# Patient Record
Sex: Male | Born: 1975
Health system: Southern US, Community
[De-identification: ages and names within clinical notes are randomized; demographics above are authoritative.]

## PROBLEM LIST (undated history)

## (undated) DIAGNOSIS — M869 Osteomyelitis, unspecified: Secondary | ICD-10-CM

## (undated) DIAGNOSIS — A4902 Methicillin resistant Staphylococcus aureus infection, unspecified site: Secondary | ICD-10-CM

## (undated) DIAGNOSIS — E291 Testicular hypofunction: Secondary | ICD-10-CM

## (undated) DIAGNOSIS — R5383 Other fatigue: Secondary | ICD-10-CM

## (undated) DIAGNOSIS — E785 Hyperlipidemia, unspecified: Secondary | ICD-10-CM

## (undated) DIAGNOSIS — E349 Endocrine disorder, unspecified: Secondary | ICD-10-CM

## (undated) DIAGNOSIS — F900 Attention-deficit hyperactivity disorder, predominantly inattentive type: Secondary | ICD-10-CM

## (undated) DIAGNOSIS — E669 Obesity, unspecified: Secondary | ICD-10-CM

## (undated) HISTORY — DX: Methicillin resistant Staphylococcus aureus infection, unspecified site: A49.02

## (undated) HISTORY — DX: Attention-deficit hyperactivity disorder, predominantly inattentive type: F90.0

## (undated) HISTORY — DX: Hyperlipidemia, unspecified: E78.5

## (undated) HISTORY — DX: Obesity, unspecified: E66.9

## (undated) HISTORY — DX: Other fatigue: R53.83

## (undated) HISTORY — DX: Osteomyelitis, unspecified: M86.9

## (undated) HISTORY — DX: Testicular hypofunction: E29.1

## (undated) HISTORY — DX: Endocrine disorder, unspecified: E34.9

---

## 2007-04-12 LAB — BASIC METABOLIC PANEL
Glucose: 88 mg/dL
Potassium: 4.2 mmol/L (ref 3.4–5.3)

## 2007-04-12 LAB — LIPID PANEL
Cholesterol: 181 mg/dL (ref 0–200)
HDL: 40 mg/dL (ref 35–70)
Triglycerides: 74 mg/dL (ref 40–160)

## 2007-04-12 LAB — HEPATIC FUNCTION PANEL: ALT: 25 U/L (ref 10–40)

## 2012-03-16 HISTORY — PX: VASECTOMY: SHX75

## 2012-03-29 LAB — CBC AND DIFFERENTIAL
HEMOGLOBIN: 15.2 g/dL (ref 13.5–17.5)
Platelets: 197 10*3/uL (ref 150–399)
WBC: 7 10^3/mL

## 2012-03-29 LAB — LIPID PANEL
CHOLESTEROL: 224 mg/dL — AB (ref 0–200)
HDL: 43 mg/dL (ref 35–70)
LDL CALC: 154 mg/dL
Triglycerides: 135 mg/dL (ref 40–160)

## 2012-03-29 LAB — TSH: TSH: 1.46 u[IU]/mL (ref 0.41–5.90)

## 2012-03-29 LAB — HEPATIC FUNCTION PANEL
ALT: 35 U/L (ref 10–40)
AST: 27 U/L (ref 14–40)

## 2012-03-29 LAB — BASIC METABOLIC PANEL
CREATININE: 0.9 mg/dL (ref 0.6–1.3)
Glucose: 94 mg/dL
Potassium: 4.7 mmol/L (ref 3.4–5.3)

## 2012-05-08 ENCOUNTER — Emergency Department (HOSPITAL_COMMUNITY): Payer: BC Managed Care – PPO

## 2012-05-08 ENCOUNTER — Encounter (HOSPITAL_COMMUNITY): Payer: Self-pay | Admitting: *Deleted

## 2012-05-08 ENCOUNTER — Emergency Department (HOSPITAL_COMMUNITY)
Admission: EM | Admit: 2012-05-08 | Discharge: 2012-05-08 | Disposition: A | Discharge status: Home / Self Care | Payer: BC Managed Care – PPO | Attending: Emergency Medicine | Admitting: Emergency Medicine

## 2012-05-08 DIAGNOSIS — N509 Disorder of male genital organs, unspecified: Secondary | ICD-10-CM | POA: Insufficient documentation

## 2012-05-08 DIAGNOSIS — N200 Calculus of kidney: Secondary | ICD-10-CM | POA: Insufficient documentation

## 2012-05-08 LAB — URINALYSIS, ROUTINE W REFLEX MICROSCOPIC
Bilirubin Urine: NEGATIVE
Specific Gravity, Urine: 1.029 (ref 1.005–1.030)
pH: 5.5 (ref 5.0–8.0)

## 2012-05-08 LAB — URINE MICROSCOPIC-ADD ON

## 2012-05-08 LAB — CBC WITH DIFFERENTIAL/PLATELET
HCT: 40.6 % (ref 39.0–52.0)
Hemoglobin: 14.4 g/dL (ref 13.0–17.0)
Lymphocytes Relative: 25 % (ref 12–46)
Lymphs Abs: 2.1 10*3/uL (ref 0.7–4.0)
Monocytes Absolute: 0.7 10*3/uL (ref 0.1–1.0)
Monocytes Relative: 8 % (ref 3–12)
Neutro Abs: 5.4 10*3/uL (ref 1.7–7.7)
WBC: 8.4 10*3/uL (ref 4.0–10.5)

## 2012-05-08 LAB — BASIC METABOLIC PANEL
BUN: 15 mg/dL (ref 6–23)
Chloride: 101 mEq/L (ref 96–112)
GFR calc Af Amer: >90 mL/min (ref >90)
Potassium: 3.4 mEq/L — ABNORMAL LOW (ref 3.5–5.1)

## 2012-05-08 MED ORDER — HYDROMORPHONE HCL PF 1 MG/ML IJ SOLN
1.0000 mg | Freq: Once | INTRAMUSCULAR | Status: AC
  Administered 2012-05-08: 1 mg via INTRAMUSCULAR
  Filled 2012-05-08: qty 1

## 2012-05-08 MED ORDER — HYDROMORPHONE HCL PF 1 MG/ML IJ SOLN
1.0000 mg | Freq: Once | INTRAMUSCULAR | Status: AC
  Administered 2012-05-08: 1 mg via INTRAVENOUS

## 2012-05-08 MED ORDER — ONDANSETRON HCL 4 MG/2ML IJ SOLN
4.0000 mg | Freq: Once | INTRAMUSCULAR | Status: AC
  Administered 2012-05-08: 4 mg via INTRAVENOUS
  Filled 2012-05-08: qty 2

## 2012-05-08 MED ORDER — SODIUM CHLORIDE 0.9 % IV SOLN
Freq: Once | INTRAVENOUS | Status: AC
  Administered 2012-05-08: 04:00:00 via INTRAVENOUS

## 2012-05-08 MED ORDER — ONDANSETRON HCL 4 MG/2ML IJ SOLN: 4.0000 mg | Freq: Once | INTRAMUSCULAR | Status: DC

## 2012-05-08 MED ORDER — HYDROMORPHONE HCL PF 1 MG/ML IJ SOLN
1.0000 mg | Freq: Once | INTRAMUSCULAR | Status: DC
  Filled 2012-05-08: qty 1

## 2012-05-08 MED ORDER — OXYCODONE-ACETAMINOPHEN 5-325 MG PO TABS: 1.0000 | ORAL_TABLET | Freq: Four times a day (QID) | ORAL | Status: DC | PRN

## 2012-05-08 MED ORDER — KETOROLAC TROMETHAMINE 30 MG/ML IJ SOLN
30.0000 mg | Freq: Once | INTRAMUSCULAR | Status: AC
  Administered 2012-05-08: 30 mg via INTRAVENOUS
  Filled 2012-05-08: qty 1

## 2012-05-08 MED ORDER — ONDANSETRON HCL 4 MG PO TABS: 4.0000 mg | ORAL_TABLET | Freq: Four times a day (QID) | ORAL | Status: DC

## 2012-05-08 NOTE — ED Notes (Signed)
Pt sts "  more medicine would be nice " will make provider aware.

## 2012-05-08 NOTE — ED Notes (Signed)
Pt returned   From c-t 

## 2012-05-08 NOTE — ED Notes (Signed)
C/o RLQ pain. Radiates into both testicles, R>L, Onset ~ 0145. (denies: nvd, fever, bleeding, back pain or urinary sx).

## 2012-05-08 NOTE — ED Provider Notes (Signed)
Medical screening examination/treatment/procedure(s) were performed by non-physician practitioner and as supervising physician I was immediately available for consultation/collaboration.  Rionna Feltes M Yanelis Osika, MD 05/08/12 0910 

## 2012-05-08 NOTE — ED Provider Notes (Signed)
History     CSN: [id number]  Arrival date & time 05/08/12  4098   First MD Initiated Contact with Patient 05/08/12 0320      Chief Complaint  Patient presents with  . Abdominal Pain    (Consider location/radiation/quality/duration/timing/severity/associated sxs/prior treatment) HPI Comments: She was awakened about 1:30 AM with right flank pain, radiating to his abdomen into his testicle.  He cannot find a comfortable position.  He isn't nauseated, but has not vomited.  He is not having any dysuria, are noted.  Hematuria, has no history of kidney stones  The history is provided by the patient.    History reviewed. No pertinent past medical history.  History reviewed. No pertinent past surgical history.  No family history on file.  History  Substance Use Topics  . Smoking status: Never Smoker   . Smokeless tobacco: Not on file  . Alcohol Use: No      Review of Systems  Constitutional: Negative for fever and appetite change.  Respiratory: Negative for shortness of breath.   Gastrointestinal: Positive for abdominal pain. Negative for abdominal distention.  Genitourinary: Positive for flank pain. Negative for frequency and penile pain.    Allergies  Review of patient's allergies indicates no known allergies.  Home Medications   Current Outpatient Rx  Name Route Sig Dispense Refill  . CETIRIZINE HCL 10 MG PO TABS Oral Take 10 mg by mouth daily.    . IBUPROFEN 200 MG PO TABS Oral Take 800 mg by mouth every 6 (six) hours as needed. For pain    . ONDANSETRON HCL 4 MG PO TABS Oral Take 1 tablet (4 mg total) by mouth every 6 (six) hours. 12 tablet 0  . OXYCODONE-ACETAMINOPHEN 5-325 MG PO TABS Oral Take 1-2 tablets by mouth every 6 (six) hours as needed for pain. 20 tablet 0    BP 137/82  Temp 97.8 F (36.6 C) (Oral)  Resp 20  SpO2 98%  Physical Exam  Constitutional: He is oriented to person, place, and time. He appears well-developed and well-nourished.  HENT:    Head: Normocephalic.  Eyes: Pupils are equal, round, and reactive to light.  Neck: Normal range of motion.  Cardiovascular: Normal rate.   Pulmonary/Chest: Effort normal.  Abdominal: Soft. He exhibits no distension. There is no tenderness.  Musculoskeletal: Normal range of motion.  Neurological: He is alert and oriented to person, place, and time.  Skin: Skin is warm. No rash noted.    ED Course  Procedures (including critical care time)  Labs Reviewed  BASIC METABOLIC PANEL - Abnormal; Notable for the following:    Potassium 3.4 (*)     Glucose, Bld 116 (*)     All other components within normal limits  URINALYSIS, ROUTINE W REFLEX MICROSCOPIC - Abnormal; Notable for the following:    APPearance CLOUDY (*)     Hgb urine dipstick LARGE (*)     Ketones, ur 15 (*)     All other components within normal limits  URINE MICROSCOPIC-ADD ON - Abnormal; Notable for the following:    Bacteria, UA FEW (*)     All other components within normal limits  CBC WITH DIFFERENTIAL   Ct Abdomen Pelvis Wo Contrast  05/08/2012  *RADIOLOGY REPORT*  Clinical Data: Right flank pain radiating to the right testicle.  CT ABDOMEN AND PELVIS WITHOUT CONTRAST  Technique:  Multidetector CT imaging of the abdomen and pelvis was performed following the standard protocol without intravenous contrast.  Comparison: None.  Findings:  Unenhanced CT was performed per clinician order.  Lack of IV contrast limits sensitivity and specificity, especially for evaluation of abdominal/pelvic solid viscera.  Lung bases are clear.  Liver, spleen, pancreas, adrenal glands, gallbladder grossly normal.  Small and large bowel appears normal.  Normal appendix. Small hiatal hernia.  Mild right hydroureteronephrosis is present.  There is a punctate calculus identified at the right UVJ.  The left ureter appears within normal limits.  There are no residual collecting system calculi identified.  No aggressive osseous lesions.  IMPRESSION:  Punctate right UVJ stone with mild right hydroureteronephrosis.  No residual collecting system calculi.   Original Report Authenticated By: Andreas Newport, M.D.      1. Kidney stone       MDM  Can reviewed.  Patient has a small, punctate calculus.  His urine is positive for hematuria.  He reports, that he is having pressure to urinate and return of nausea        Arman Filter, NP 05/08/12 [phone]

## 2012-05-08 NOTE — ED Notes (Signed)
Pt complains of right lower abd pain, radiaiitng to testicles, no hx of kidney stones, pt sts he still has all internal organs. deneis nausea, onset at 0130

## 2012-05-08 NOTE — ED Notes (Signed)
Patient transported to CT 

## 2012-11-21 ENCOUNTER — Emergency Department
Admission: EM | Admit: 2012-11-21 | Discharge: 2012-11-21 | Disposition: A | Discharge status: Home / Self Care | Payer: BC Managed Care – PPO | Source: Home / Self Care | Attending: Family Medicine | Admitting: Family Medicine

## 2012-11-21 ENCOUNTER — Encounter: Payer: Self-pay | Admitting: *Deleted

## 2012-11-21 DIAGNOSIS — N498 Inflammatory disorders of other specified male genital organs: Secondary | ICD-10-CM

## 2012-11-21 DIAGNOSIS — L02214 Cutaneous abscess of groin: Secondary | ICD-10-CM

## 2012-11-21 DIAGNOSIS — N492 Inflammatory disorders of scrotum: Secondary | ICD-10-CM

## 2012-11-21 NOTE — ED Notes (Signed)
Richard Cordova reports two abscess x 5 days, one in right groin and on posterior testicular. He was placed on Bactrim and doxycycline 3 days ago @ an urgent care. Site is not improving. Denies fever or chills. He reports his son has a current wound and he scratched him while changing his diaper recently.

## 2012-11-21 NOTE — ED Provider Notes (Signed)
History     CSN: [id number]  Arrival date & time 11/21/12  6578   First MD Initiated Contact with Patient 11/21/12 [phone]      Chief Complaint  Patient presents with  . Cyst    HPI Groin abscesses x5 days. Patient reports developing a right groin and left posterior testicle abscess about 5 days ago. Patient states his 66-month-old son has an abscess and he believes he may have been cross contaminated from this when he was changing a diaper. Patient was initially seen in urgent care about 3-4 days ago with symptoms. Patient states that sees returning at the time. Patient placed on doxycycline Bactrim for soft tissue coverage. Patient states that periods have progressively swollen over the past 3-4 days despite antibiotics. Patient denies any fevers or chills. Has had some drainage albeit minimal.   History reviewed. No pertinent past medical history.  Past Surgical History  Procedure Laterality Date  . Vasectomy      Family History  Problem Relation Age of Onset  . Cancer Father     testicular    History  Substance Use Topics  . Smoking status: Never Smoker   . Smokeless tobacco: Never Used  . Alcohol Use: Yes      Review of Systems  All other systems reviewed and are negative.    Allergies  Review of patient's allergies indicates no known allergies.  Home Medications   Current Outpatient Rx  Name  Route  Sig  Dispense  Refill  . doxycycline (VIBRA-TABS) 100 MG tablet   Oral   Take 100 mg by mouth 2 (two) times daily.         Marland Kitchen sulfamethoxazole-trimethoprim (BACTRIM,SEPTRA) 400-80 MG per tablet   Oral   Take 1 tablet by mouth 2 (two) times daily.         . cetirizine (ZYRTEC) 10 MG tablet   Oral   Take 10 mg by mouth daily.         Marland Kitchen ibuprofen (ADVIL,MOTRIN) 200 MG tablet   Oral   Take 800 mg by mouth every 6 (six) hours as needed. For pain         . ondansetron (ZOFRAN) 4 MG tablet   Oral   Take 1 tablet (4 mg total) by mouth every 6  (six) hours.   12 tablet   0     BP 128/82  Pulse 68  Temp(Src) 98.1 F (36.7 C) (Oral)  Resp 14  Ht 6' (1.829 m)  Wt 286 lb (129.729 kg)  BMI 38.78 kg/m2  SpO2 98%  Physical Exam  Constitutional:  Obese  HENT:  Head: Normocephalic and atraumatic.  Eyes: Conjunctivae are normal. Pupils are equal, round, and reactive to light.  Neck: Normal range of motion.  Cardiovascular: Normal rate and regular rhythm.   Pulmonary/Chest: Effort normal.  Abdominal: Soft.  Genitourinary:     [place] left testicular swelling with central area of abscess that is draining a minimal amount of serous fluid.  Small right intercrural abscess in the right groin to  Musculoskeletal: Normal range of motion.  Neurological: He is alert.    ED Course  Procedures (including critical care time)  Labs Reviewed - No data to display No results found.   1. Scrotal abscess   2. Groin abscess       MDM  Noted left testicular abscess with marked hypertrophy swelling and tenderness. Will formally refer patient to urology for further management of this. Patient states he has urologist  that he's been seen for vasectomy about 3 months ago. Patient will make same day appointment for evaluation of symptoms.     The patient and/or caregiver has been counseled thoroughly with regard to treatment plan and/or medications prescribed including dosage, schedule, interactions, rationale for use, and possible side effects and they verbalize understanding. Diagnoses and expected course of recovery discussed and will return if not improved as expected or if the condition worsens. Patient and/or caregiver verbalized understanding.             Doree Albee, MD 11/21/12 1002

## 2012-12-21 ENCOUNTER — Ambulatory Visit: Payer: BC Managed Care – PPO | Admitting: Internal Medicine

## 2013-07-23 ENCOUNTER — Encounter: Payer: Self-pay | Admitting: Emergency Medicine

## 2013-07-23 ENCOUNTER — Emergency Department
Admission: EM | Admit: 2013-07-23 | Discharge: 2013-07-23 | Disposition: A | Discharge status: Home / Self Care | Payer: BC Managed Care – PPO | Source: Home / Self Care | Attending: Family Medicine | Admitting: Family Medicine

## 2013-07-23 DIAGNOSIS — L02419 Cutaneous abscess of limb, unspecified: Secondary | ICD-10-CM

## 2013-07-23 LAB — POCT CBC W AUTO DIFF (K'VILLE URGENT CARE)

## 2013-07-23 MED ORDER — CLINDAMYCIN HCL 150 MG PO CAPS: 150.0000 mg | ORAL_CAPSULE | Freq: Three times a day (TID) | ORAL | Status: DC

## 2013-07-23 MED ORDER — CLINDAMYCIN HCL 300 MG PO CAPS: 300.0000 mg | ORAL_CAPSULE | Freq: Three times a day (TID) | ORAL | Status: DC

## 2013-07-23 MED ORDER — HYDROCODONE-ACETAMINOPHEN 5-325 MG PO TABS: 1.0000 | ORAL_TABLET | Freq: Four times a day (QID) | ORAL | Status: DC | PRN

## 2013-07-23 NOTE — ED Provider Notes (Signed)
CSN: [id number]     Arrival date & time 07/23/13  4782 History   First MD Initiated Contact with Patient 07/23/13 1023     Chief Complaint  Patient presents with  . Abscess    left thigh     HPI Comments: Patient developed a small abscess on his left upper anterior thigh about 2.5 weeks ago that initially drained a small amount of pus.  The lesion has gradually increased in size and tenderness.  Over the past 2 days he has developed increasing erythema and warmth around the lesion.  No fevers, chills, and sweats.  He has a past history of abscess with MRSA.  Patient is a 37 y.o. male presenting with abscess. The history is provided by the patient.  Abscess Location:  Leg Leg abscess location:  L upper leg Size:  4cm by 3cm Abscess quality: fluctuance, painful, redness and warmth   Abscess quality: not draining and not weeping   Red streaking: no   Duration:  3 weeks Progression:  Worsening Pain details:    Quality:  Aching   Severity:  Mild   Duration:  3 days   Timing:  Constant   Progression:  Worsening Chronicity:  Recurrent Context: not diabetes   Relieved by:  Nothing Worsened by:  Nothing tried Ineffective treatments:  Draining/squeezing Associated symptoms: fatigue   Associated symptoms: no anorexia, no fever, no headaches and no nausea   Risk factors: family hx of MRSA, hx of MRSA and prior abscess     History reviewed. No pertinent past medical history. Past Surgical History  Procedure Laterality Date  . Vasectomy     Family History  Problem Relation Age of Onset  . Cancer Father     testicular   History  Substance Use Topics  . Smoking status: Never Smoker   . Smokeless tobacco: Never Used  . Alcohol Use: Yes    Review of Systems  Constitutional: Positive for fatigue. Negative for fever.  Gastrointestinal: Negative for nausea and anorexia.  Neurological: Negative for headaches.  All other systems reviewed and are negative.    Allergies  Review of  patient's allergies indicates no known allergies.  Home Medications   Current Outpatient Rx  Name  Route  Sig  Dispense  Refill  . cetirizine (ZYRTEC) 10 MG tablet   Oral   Take 10 mg by mouth daily.         . clindamycin (CLEOCIN) 150 MG capsule   Oral   Take 1 capsule (150 mg total) by mouth 3 (three) times daily.   21 capsule   0   . clindamycin (CLEOCIN) 300 MG capsule   Oral   Take 1 capsule (300 mg total) by mouth 3 (three) times daily.   21 capsule   0   . HYDROcodone-acetaminophen (NORCO/VICODIN) 5-325 MG per tablet   Oral   Take 1 tablet by mouth every 6 (six) hours as needed for moderate pain.   12 tablet   0   . ibuprofen (ADVIL,MOTRIN) 200 MG tablet   Oral   Take 800 mg by mouth every 6 (six) hours as needed. For pain          BP 147/76  Pulse 72  Temp(Src) 99.4 F (37.4 C) (Oral)  Resp 14  Ht 6' (1.829 m)  Wt 305 lb (138.347 kg)  BMI 41.36 kg/m2  SpO2 97% Physical Exam Nursing notes and Vital Signs reviewed. Appearance:  Patient appears stated age, and in no acute distress.  Patient is obese (BMI 41.4) Eyes:  Pupils are equal, round, and reactive to light and accomodation.  Extraocular movement is intact.  Conjunctivae are not inflamed   Pharynx:  Normal Neck:  Supple.  No adenopathy Lungs:  Clear to auscultation.  Breath sounds are equal.  Heart:  Regular rate and rhythm without murmurs, rubs, or gallops.  Extremities:  No edema.   Skin:  Left upper anterior thigh just inferior to inguinal crease has a tender fluctuant area about 3cm by 4cm with surrounding erythema      ED Course  Procedures  Incise and drain cyst/abscess Risks and benefits of procedure explained to patient and verbal consent obtained.  Using sterile technique and local anesthesia with 1% lidocaine with epinephrine, cleansed affected area with Betadine and saline. Identified the most fluctuant area of lesion and incised with #11 blade.  Expressed blood and purulent  material. Explored abscess cavity to break up loculations.  Inserted Iodoform gauze packing.  Bandage applied.  Patient tolerated well     Labs Reviewed  WOUND CULTURE  POCT CBC W AUTO DIFF ([facility])  WBC 12.9; LY 19.1; MO 2.7; GR 78.2; Hgb 14.7; Platelets 224    Imaging Review     MDM   1. Abscess of thigh; suspect MRSA.  Note mild leukocytosis 12.9    Wound culture pending.  Begin empiric clindaymcin 450mg  TID.  Lortab for pain.  May continue ibuprofen. Leave bandage in place until follow-up tomorrow.  Keep wound clean and dry.  May apply heating pad 2 or 3 times daily. Return for follow-up tomorrow.    Lattie Haw, MD 07/23/13 [phone]

## 2013-07-23 NOTE — ED Notes (Signed)
Denzell c/o abscess to left upper thigh/groin x 1 1/2 weeks. Site was drianing pus and stopped. Site is now red and warm, he generally feels weak and "shaky".

## 2013-07-24 ENCOUNTER — Encounter: Payer: Self-pay | Admitting: Family Medicine

## 2013-07-24 ENCOUNTER — Ambulatory Visit (INDEPENDENT_AMBULATORY_CARE_PROVIDER_SITE_OTHER): Payer: BC Managed Care – PPO | Admitting: Family Medicine

## 2013-07-24 VITALS — BP 131/82 | HR 76 | Ht 71.0 in | Wt 316.0 lb

## 2013-07-24 DIAGNOSIS — E669 Obesity, unspecified: Secondary | ICD-10-CM

## 2013-07-24 DIAGNOSIS — R5381 Other malaise: Secondary | ICD-10-CM

## 2013-07-24 DIAGNOSIS — R5383 Other fatigue: Secondary | ICD-10-CM | POA: Insufficient documentation

## 2013-07-24 DIAGNOSIS — A4902 Methicillin resistant Staphylococcus aureus infection, unspecified site: Secondary | ICD-10-CM

## 2013-07-24 DIAGNOSIS — L0390 Cellulitis, unspecified: Secondary | ICD-10-CM

## 2013-07-24 DIAGNOSIS — L0291 Cutaneous abscess, unspecified: Secondary | ICD-10-CM

## 2013-07-24 HISTORY — DX: Obesity, unspecified: E66.9

## 2013-07-24 HISTORY — DX: Other fatigue: R53.83

## 2013-07-24 HISTORY — DX: Methicillin resistant Staphylococcus aureus infection, unspecified site: A49.02

## 2013-07-24 MED ORDER — MUPIROCIN 2 % EX OINT: 1.0000 "application " | TOPICAL_OINTMENT | Freq: Two times a day (BID) | CUTANEOUS | Status: DC

## 2013-07-24 NOTE — Patient Instructions (Addendum)
If decolonization is pursued, we favor a 5- to 10-day course of therapy with the following topical agents [75,94,96]: ?Chlorhexidine gluconate daily washes (2 or 4 percent solution)  ?Mupirocin ointment (2 percent) applied to nares with a cotton-tipped applicator two to three times daily

## 2013-07-24 NOTE — Progress Notes (Signed)
CC: Nicolus Ose is a 37 y.o. male is here for MRSA   Subjective: HPI:  Very pleasant 37 year old here to establish care  Patient was seen in urgent care yesterday for incision and drainage of abscess on the left thigh. He reports that pain is significantly improved he is unsure about the redness surrounding the incision point. He has been taking clindamycin 250 mg 3 times a day without any side effects. He denies fevers, chills, nor any new skin complaints. He reports that he has had multiple MRSA culture confirmed infections and has failed doxycycline and Bactrim combination for the past. He would like know how he can try to get red of MRSA indefinitely.  Patient reports a history of fatigue that stems back over the last one or 2 years. He tells me he has had his testosterone checked in the past and it has been in the low 300s he believes it was 301 total.  He reports that he only has lack of energy without shortness of breath or weakness. Denies focal weakness motor or sensory disturbances. He has been on antidepressant medication in the past however it may be above symptoms worse.  Fatigue has also been accompanied by mild subjective depression, decreased libido.  Review of Systems - General ROS: negative for - chills, fever, night sweats, weight gain or weight loss Ophthalmic ROS: negative for - decreased vision Psychological ROS: negative for - anxiety or depression ENT ROS: negative for - hearing change, nasal congestion, tinnitus or allergies Hematological and Lymphatic ROS: negative for - bleeding problems, bruising or swollen lymph nodes Breast ROS: negative Respiratory ROS: no cough, shortness of breath, or wheezing Cardiovascular ROS: no chest pain or dyspnea on exertion Gastrointestinal ROS: no abdominal pain, change in bowel habits, or black or bloody stools Genito-Urinary ROS: negative for - genital discharge, genital ulcers, incontinence or abnormal bleeding from  genitals Musculoskeletal ROS: negative for - joint pain or muscle pain Neurological ROS: negative for - headaches or memory loss Dermatological ROS: negative for lumps, mole changes, rash and skin lesion changes other than that described above  Past Medical History  Diagnosis Date  . Obesity 07/24/2013     Family History  Problem Relation Age of Onset  . Cancer Father     testicular  . Alcoholism Father   . Testicular cancer Father      History  Substance Use Topics  . Smoking status: Never Smoker   . Smokeless tobacco: Never Used  . Alcohol Use: Yes     Objective: Filed Vitals:   07/24/13 1327  BP: 131/82  Pulse: 76    General: Alert and Oriented, No Acute Distress HEENT: Pupils equal, round, reactive to light. Conjunctivae clear.  Moist mucous membranes pharynx unremarkable Lungs: Clear to auscultation bilaterally, no wheezing/ronchi/rales.  Comfortable work of breathing. Good air movement. Cardiac: Regular rate and rhythm. Normal S1/S2.  No murmurs, rubs, nor gallops.   Abdomen: Obese and soft nontender Extremities: No peripheral edema.  Strong peripheral pulses. Trace induration and 1 cm periphery and moderate erythema at least 5 centers in the periphery of a 1 cm incision in the left proximal medial thigh region. Scant bloody and purulent discharge from a wick Mental Status: No depression, anxiety, nor agitation. Skin: Warm and dry.  Assessment & Plan: Mandell was seen today for mrsa.  Diagnoses and associated orders for this visit:  Fatigue - Testosterone, free, total  Cellulitis  Obesity  MRSA infection - mupirocin ointment (BACTROBAN) 2 %; Place 1  application into the nose 2 (two) times daily.    Fatigue: Given past evidence of hypogonadism will test total testosterone and if low will obtain confirmatory test, PSA, hemoglobin. If normal will strongly consider sleep study given his obesity Cellulitis: Improving, Incision and drainage site appears to be  clean and well healing, wick was replaced with a new iodoform quarter-inch by approximately 7 cm packing, patient was instructed on how to remove one centimeter everyday if he chooses not to followup every 2-3 days for repacking. Instructed to keep area clean and dry, certainly return on the day that the wick is removed on its own. Continue clindamycin For his recurrent MRSA infections we discussed using chlorhexidine along with Bactroban, encouraged to do this for 5 days as outlined in the patient's instruction session Signs and symptoms requring emergent/urgent reevaluation were discussed with the patient.  45 minutes spent face-to-face during visit today of which at least 50% was counseling or coordinating care regarding fatigue, cellulitis, MRSA infection.   Return if symptoms worsen or fail to improve.

## 2013-07-26 ENCOUNTER — Telehealth: Payer: Self-pay | Admitting: *Deleted

## 2013-07-26 ENCOUNTER — Encounter: Payer: Self-pay | Admitting: Family Medicine

## 2013-07-26 DIAGNOSIS — E349 Endocrine disorder, unspecified: Secondary | ICD-10-CM

## 2013-07-26 HISTORY — DX: Endocrine disorder, unspecified: E34.9

## 2013-07-26 LAB — WOUND CULTURE: Gram Stain: NONE SEEN

## 2013-07-26 LAB — TESTOSTERONE, FREE, TOTAL, SHBG: Testosterone: 111 ng/dL — ABNORMAL LOW (ref 300–890)

## 2013-07-26 NOTE — Telephone Encounter (Signed)
Pt would like to proceed with with testosterone replacement therapy.  Future orders for confirmatory labs have been placed.Just need to release the order when pt calls

## 2013-08-02 ENCOUNTER — Encounter: Payer: Self-pay | Admitting: *Deleted

## 2013-08-07 ENCOUNTER — Encounter: Payer: Self-pay | Admitting: Family Medicine

## 2013-08-07 ENCOUNTER — Encounter: Payer: Self-pay | Admitting: *Deleted

## 2013-08-07 ENCOUNTER — Ambulatory Visit (INDEPENDENT_AMBULATORY_CARE_PROVIDER_SITE_OTHER): Payer: BC Managed Care – PPO | Admitting: Family Medicine

## 2013-08-07 VITALS — BP 133/81 | HR 75 | Wt 316.0 lb

## 2013-08-07 DIAGNOSIS — Z13220 Encounter for screening for lipoid disorders: Secondary | ICD-10-CM

## 2013-08-07 DIAGNOSIS — Z79899 Other long term (current) drug therapy: Secondary | ICD-10-CM

## 2013-08-07 DIAGNOSIS — R739 Hyperglycemia, unspecified: Secondary | ICD-10-CM

## 2013-08-07 DIAGNOSIS — E291 Testicular hypofunction: Secondary | ICD-10-CM

## 2013-08-07 DIAGNOSIS — E349 Endocrine disorder, unspecified: Secondary | ICD-10-CM

## 2013-08-07 DIAGNOSIS — R7309 Other abnormal glucose: Secondary | ICD-10-CM

## 2013-08-07 LAB — LIPID PANEL
HDL: 47 mg/dL (ref >39)
LDL Cholesterol: 140 mg/dL — ABNORMAL HIGH (ref 0–99)
Total CHOL/HDL Ratio: 4.3 Ratio
VLDL: 17 mg/dL (ref 0–40)

## 2013-08-07 LAB — BASIC METABOLIC PANEL WITH GFR
Calcium: 9.4 mg/dL (ref 8.4–10.5)
Creat: 0.72 mg/dL (ref 0.50–1.35)
GFR, Est African American: >89 mL/min
GFR, Est Non African American: >89 mL/min
Sodium: 138 mEq/L (ref 135–145)

## 2013-08-07 NOTE — Progress Notes (Signed)
CC: Richard Cordova is a 37 y.o. male is here for Follow-up   Subjective: HPI:  Followup hypo-testosterone: Total testosterone 100 in the setting of fatigue decreased libido. He has been on replacement therapy in the past using injectable formulation however unsure of dosing or frequency. He denies intolerance he had to stop due to finances. He is interested in replacement therapy again. Denies family history or personal history of prostate cancer nor polycythemia.  Denies exertional chest pain, heart disease.  A. nonfasting basic metabolic panel revealed a slightly elevated sugar level back in September.  He does not think he's never had cholesterol screening for dyslipidemia   Review Of Systems Outlined In HPI  Past Medical History  Diagnosis Date  . Obesity 07/24/2013     Family History  Problem Relation Age of Onset  . Cancer Father     testicular  . Alcoholism Father   . Testicular cancer Father      History  Substance Use Topics  . Smoking status: Never Smoker   . Smokeless tobacco: Never Used  . Alcohol Use: Yes     Objective: Filed Vitals:   08/07/13 0817  BP: 133/81  Pulse: 75    Vital signs reviewed. General: Alert and Oriented, No Acute Distress HEENT: Pupils equal, round, reactive to light. Conjunctivae clear.  External ears unremarkable.  Moist mucous membranes. Lungs: Clear and comfortable work of breathing, speaking in full sentences without accessory muscle use. Cardiac: Regular rate and rhythm.  Neuro: CN II-XII grossly intact, gait normal. Extremities: No peripheral edema.  Strong peripheral pulses.  Mental Status: No depression, anxiety, nor agitation. Logical though process. Skin: Warm and dry.  Assessment & Plan: Richard Cordova was seen today for follow-up.  Diagnoses and associated orders for this visit:  Hypotestosteronism - Testosterone, free, total  Lipid screening - Lipid panel  Hyperglycemia - BASIC METABOLIC PANEL WITH GFR  High risk  medication use - PSA - Hemoglobin    Low testosterone: Uncontrolled recheck in total testosterone along with PSA and hemoglobin, his testosterone again low and the latter labs are normal will restart testosterone hearing clinic every 3 weeks Hyperglycemia: Checking fasting blood sugar today Due for dyslipidemia screening  Return if symptoms worsen or fail to improve.

## 2013-08-08 LAB — TESTOSTERONE, FREE, TOTAL, SHBG
Sex Hormone Binding: 23 nmol/L (ref 13–71)
Testosterone: 220 ng/dL — ABNORMAL LOW (ref 300–890)

## 2013-08-09 ENCOUNTER — Telehealth: Payer: Self-pay | Admitting: Family Medicine

## 2013-08-09 ENCOUNTER — Encounter: Payer: Self-pay | Admitting: Family Medicine

## 2013-08-09 DIAGNOSIS — E785 Hyperlipidemia, unspecified: Secondary | ICD-10-CM | POA: Insufficient documentation

## 2013-08-09 HISTORY — DX: Hyperlipidemia, unspecified: E78.5

## 2013-08-09 NOTE — Telephone Encounter (Signed)
Sue Lush, Will you please let Richard Cordova know that his second testosterone test confirmed a deficiency.  He can begin injections in our office every three weeks since his hemoglobin and PSA were normal.  On a side note, his fasting blood sugar was perfect.  Cholesterol is technically elevated but not to a degree that he needs to consider medications yet.  We'll check this sometime in the spring after he starts testosterone.

## 2013-08-10 NOTE — Telephone Encounter (Signed)
Pt notified and transferred up front to schedule a nurse visit

## 2013-08-13 ENCOUNTER — Encounter: Payer: Self-pay | Admitting: *Deleted

## 2013-08-13 ENCOUNTER — Ambulatory Visit (INDEPENDENT_AMBULATORY_CARE_PROVIDER_SITE_OTHER): Payer: BC Managed Care – PPO | Admitting: Family Medicine

## 2013-08-13 VITALS — BP 139/81 | HR 84

## 2013-08-13 DIAGNOSIS — E291 Testicular hypofunction: Secondary | ICD-10-CM

## 2013-08-13 MED ORDER — TESTOSTERONE CYPIONATE 200 MG/ML IM SOLN
300.0000 mg | Freq: Once | INTRAMUSCULAR | Status: AC
  Administered 2013-08-13: 300 mg via INTRAMUSCULAR

## 2013-08-13 NOTE — Progress Notes (Signed)
   Subjective:    Patient ID: Richard Cordova, male    DOB: 11/11/1975, 37 y.o.   MRN: [id number] Testosterone injection given IM LUOQ. No complications. Anett Ranker, CMA HPI    Review of Systems     Objective:   Physical Exam        Assessment & Plan:   

## 2013-08-13 NOTE — Progress Notes (Signed)
I was present for all necessary aspect of today's encounter

## 2013-08-29 ENCOUNTER — Encounter: Payer: Self-pay | Admitting: *Deleted

## 2013-08-29 ENCOUNTER — Encounter: Payer: Self-pay | Admitting: Family Medicine

## 2013-08-29 DIAGNOSIS — B009 Herpesviral infection, unspecified: Secondary | ICD-10-CM | POA: Insufficient documentation

## 2013-09-03 ENCOUNTER — Ambulatory Visit (INDEPENDENT_AMBULATORY_CARE_PROVIDER_SITE_OTHER): Payer: BC Managed Care – PPO | Admitting: Family Medicine

## 2013-09-03 ENCOUNTER — Encounter: Payer: Self-pay | Admitting: *Deleted

## 2013-09-03 VITALS — BP 139/86 | HR 76 | Wt 322.0 lb

## 2013-09-03 DIAGNOSIS — E291 Testicular hypofunction: Secondary | ICD-10-CM

## 2013-09-03 MED ORDER — TESTOSTERONE CYPIONATE 200 MG/ML IM SOLN
300.0000 mg | INTRAMUSCULAR | Status: DC
  Administered 2013-09-03: 300 mg via INTRAMUSCULAR

## 2013-09-03 NOTE — Progress Notes (Signed)
Patient was in office for Testosterone injection 300 ml was given RUOQ. Patient did not have any complaints of headaches, dizziness or shortness of breath. Rhonda Cunningham,CMA

## 2013-09-24 ENCOUNTER — Encounter: Payer: Self-pay | Admitting: *Deleted

## 2013-09-24 ENCOUNTER — Ambulatory Visit (INDEPENDENT_AMBULATORY_CARE_PROVIDER_SITE_OTHER): Payer: BC Managed Care – PPO | Admitting: Family Medicine

## 2013-09-24 VITALS — BP 123/79 | HR 65 | Temp 98.1°F | Ht 72.0 in | Wt 316.0 lb

## 2013-09-24 DIAGNOSIS — E349 Endocrine disorder, unspecified: Secondary | ICD-10-CM

## 2013-09-24 DIAGNOSIS — E291 Testicular hypofunction: Secondary | ICD-10-CM

## 2013-09-24 MED ORDER — TESTOSTERONE CYPIONATE 200 MG/ML IM SOLN
300.0000 mg | Freq: Once | INTRAMUSCULAR | Status: AC
  Administered 2013-09-24: 300 mg via INTRAMUSCULAR

## 2013-09-24 NOTE — Progress Notes (Signed)
Pt here for testosterone injection no SOB or CP. Pt tolerated injection well in LUOQ. Pt to RTC in 3wks.Loralee PacasBarkley, Marian Meneely BuckinghamLynetta

## 2013-10-15 ENCOUNTER — Ambulatory Visit (INDEPENDENT_AMBULATORY_CARE_PROVIDER_SITE_OTHER): Payer: BC Managed Care – PPO | Admitting: Family Medicine

## 2013-10-15 ENCOUNTER — Encounter: Payer: Self-pay | Admitting: Family Medicine

## 2013-10-15 VITALS — BP 132/74 | HR 75 | Wt 320.0 lb

## 2013-10-15 DIAGNOSIS — E291 Testicular hypofunction: Secondary | ICD-10-CM

## 2013-10-15 DIAGNOSIS — E349 Endocrine disorder, unspecified: Secondary | ICD-10-CM

## 2013-10-15 MED ORDER — TESTOSTERONE CYPIONATE 200 MG/ML IM SOLN
300.0000 mg | INTRAMUSCULAR | Status: DC
  Administered 2013-10-15: 300 mg via INTRAMUSCULAR

## 2013-10-15 MED ORDER — TESTOSTERONE CYPIONATE 200 MG/ML IM SOLN
200.0000 mg | Freq: Once | INTRAMUSCULAR | Status: DC
Start: 2013-10-15 — End: 2013-10-15

## 2013-10-15 NOTE — Progress Notes (Signed)
Pt here for testosterone injection no SOB,CP or mood swings. Injection tolerated well given RUOQ.Richard Cordova  

## 2013-11-05 ENCOUNTER — Ambulatory Visit (INDEPENDENT_AMBULATORY_CARE_PROVIDER_SITE_OTHER): Payer: BC Managed Care – PPO | Admitting: Family Medicine

## 2013-11-05 ENCOUNTER — Encounter: Payer: Self-pay | Admitting: *Deleted

## 2013-11-05 VITALS — BP 119/74 | HR 69 | Wt 321.0 lb

## 2013-11-05 DIAGNOSIS — E291 Testicular hypofunction: Secondary | ICD-10-CM

## 2013-11-05 DIAGNOSIS — E349 Endocrine disorder, unspecified: Secondary | ICD-10-CM

## 2013-11-05 MED ORDER — TESTOSTERONE CYPIONATE 200 MG/ML IM SOLN
300.0000 mg | Freq: Once | INTRAMUSCULAR | Status: AC
  Administered 2013-11-05: 300 mg via INTRAMUSCULAR

## 2013-11-05 NOTE — Progress Notes (Signed)
   Subjective:    Patient ID: Richard HammockJohn Cordova, male    DOB: Mar 07, 1976, 38 y.o.   MRN: [id number] Testosterone injection given IM LUOQ. No complications. Richard AnonAmber Stephanieann Cordova, CMA HPI    Review of Systems     Objective:   Physical Exam        Assessment & Plan:

## 2013-11-26 ENCOUNTER — Ambulatory Visit (INDEPENDENT_AMBULATORY_CARE_PROVIDER_SITE_OTHER): Payer: BC Managed Care – PPO | Admitting: Family Medicine

## 2013-11-26 VITALS — BP 120/73 | HR 74 | Ht 72.0 in | Wt 325.0 lb

## 2013-11-26 DIAGNOSIS — E291 Testicular hypofunction: Secondary | ICD-10-CM

## 2013-11-26 MED ORDER — TESTOSTERONE CYPIONATE 200 MG/ML IM SOLN
300.0000 mg | INTRAMUSCULAR | Status: DC
  Administered 2013-11-26: 300 mg via INTRAMUSCULAR

## 2013-11-26 NOTE — Progress Notes (Signed)
I was present for all necessary aspects of today's encounter. 

## 2013-12-14 ENCOUNTER — Ambulatory Visit: Payer: BC Managed Care – PPO

## 2013-12-14 ENCOUNTER — Encounter: Payer: Self-pay | Admitting: Family Medicine

## 2013-12-14 ENCOUNTER — Ambulatory Visit (INDEPENDENT_AMBULATORY_CARE_PROVIDER_SITE_OTHER): Payer: BC Managed Care – PPO | Admitting: Family Medicine

## 2013-12-14 VITALS — BP 133/73 | HR 70 | Ht 72.0 in | Wt 329.0 lb

## 2013-12-14 DIAGNOSIS — E291 Testicular hypofunction: Secondary | ICD-10-CM

## 2013-12-14 DIAGNOSIS — J309 Allergic rhinitis, unspecified: Secondary | ICD-10-CM

## 2013-12-14 DIAGNOSIS — J302 Other seasonal allergic rhinitis: Secondary | ICD-10-CM

## 2013-12-14 MED ORDER — TESTOSTERONE CYPIONATE 200 MG/ML IM SOLN
300.0000 mg | INTRAMUSCULAR | Status: DC
  Administered 2013-12-14: 300 mg via INTRAMUSCULAR

## 2013-12-14 MED ORDER — MONTELUKAST SODIUM 10 MG PO TABS: 10.0000 mg | ORAL_TABLET | Freq: Every day | ORAL | Status: DC

## 2013-12-14 MED ORDER — ALBUTEROL SULFATE HFA 108 (90 BASE) MCG/ACT IN AERS: INHALATION_SPRAY | RESPIRATORY_TRACT | Status: DC

## 2013-12-14 NOTE — Progress Notes (Signed)
CC: Richard HammockJohn Cordova is a 38 y.o. male is here for Shortness of Breath   Subjective: HPI:  Complains of shortness of breath the last week. Present all hours of the day but seems to be worse when he is at home. Described as inability to take a full deep breath. Accompanied by snoring and what his wife describes as painting while sleeping. Symptoms are greatly improved soon after taking Zyrtec, effectiveness last 5 hours however slowly wears off throughout the day. Accompanied by subjective postnasal drip. Denies fevers, chills, orthopnea, peripheral edema, cough,, chest pain, nor rashes. Review of systems is positive for wheezing.   Review Of Systems Outlined In HPI  Past Medical History  Diagnosis Date  . Obesity 07/24/2013    Past Surgical History  Procedure Laterality Date  . Vasectomy  03/2012   Family History  Problem Relation Age of Onset  . Cancer Father     testicular  . Alcoholism Father   . Testicular cancer Father     History   Social History  . Marital Status: Married    Spouse Name: N/A    Number of Children: N/A  . Years of Education: N/A   Occupational History  . Not on file.   Social History Main Topics  . Smoking status: Never Smoker   . Smokeless tobacco: Never Used  . Alcohol Use: Yes  . Drug Use: No  . Sexual Activity: Not on file   Other Topics Concern  . Not on file   Social History Narrative  . No narrative on file     Objective: BP 133/73  Pulse 70  Ht 6' (1.829 m)  Wt 329 lb (149.233 kg)  BMI 44.61 kg/m2  General: Alert and Oriented, No Acute Distress HEENT: Pupils equal, round, reactive to light. Conjunctivae clear.  External ears unremarkable, canals clear with intact TMs with appropriate landmarks.  Middle ear appears open without effusion. Pink inferior turbinates.  Moist mucous membranes, pharynx without inflammation nor lesions however mild cobblestoning.  Neck supple without palpable lymphadenopathy nor abnormal masses. Lungs: Clear to  auscultation bilaterally, no wheezing/ronchi/rales.  Comfortable work of breathing. Good air movement. Cardiac: Regular rate and rhythm. Normal S1/S2.  No murmurs, rubs, nor gallops.   Extremities: No peripheral edema.  Strong peripheral pulses.  Mental Status: No depression, anxiety, nor agitation. Skin: Warm and dry.  Assessment & Plan: Richard Cordova was seen today for shortness of breath.  Diagnoses and associated orders for this visit:  Seasonal allergies - montelukast (SINGULAIR) 10 MG tablet; Take 1 tablet (10 mg total) by mouth at bedtime. - albuterol (PROVENTIL HFA;VENTOLIN HFA) 108 (90 BASE) MCG/ACT inhaler; Inhale two puffs every 4-6 hours only as needed for shortness of breath or wheezing.  Male hypogonadism - testosterone cypionate (DEPOTESTOTERONE CYPIONATE) injection 300 mg; Inject 1.5 mLs (300 mg total) into the muscle every 14 (fourteen) days.    Seasonal allergies are most likely causing shortness of breath with an element of reactive airway disease therefore start Singulair. Albuterol only if needed.  He is due for his every 2 weeks Testosterone injection today.    Return in about 4 weeks (around 01/11/2014), or if symptoms worsen or fail to improve.

## 2013-12-28 ENCOUNTER — Ambulatory Visit (INDEPENDENT_AMBULATORY_CARE_PROVIDER_SITE_OTHER): Payer: BC Managed Care – PPO | Admitting: Family Medicine

## 2013-12-28 VITALS — BP 114/44 | HR 79 | Ht 72.0 in | Wt 328.0 lb

## 2013-12-28 DIAGNOSIS — E291 Testicular hypofunction: Secondary | ICD-10-CM

## 2013-12-28 MED ORDER — TESTOSTERONE CYPIONATE 200 MG/ML IM SOLN
300.0000 mg | INTRAMUSCULAR | Status: DC
  Administered 2013-12-28: 300 mg via INTRAMUSCULAR

## 2013-12-28 NOTE — Progress Notes (Signed)
I was present for all necessary aspects of today's encounter.  Bjorn LoserRhonda,  Can you please document how much was given and where it was administered?

## 2013-12-28 NOTE — Progress Notes (Signed)
Patient was in office for Testosterone injection. 300 mg was given RUOQ. Patient denied any headaches, SOB or dizziness. Sharell Hilmer,CMA

## 2014-01-18 ENCOUNTER — Encounter: Payer: Self-pay | Admitting: *Deleted

## 2014-01-18 ENCOUNTER — Ambulatory Visit (INDEPENDENT_AMBULATORY_CARE_PROVIDER_SITE_OTHER): Payer: BC Managed Care – PPO | Admitting: Family Medicine

## 2014-01-18 VITALS — BP 111/73 | HR 73 | Wt 312.0 lb

## 2014-01-18 DIAGNOSIS — E291 Testicular hypofunction: Secondary | ICD-10-CM

## 2014-01-18 MED ORDER — TESTOSTERONE CYPIONATE 200 MG/ML IM SOLN
300.0000 mg | Freq: Once | INTRAMUSCULAR | Status: AC
  Administered 2014-01-18: 300 mg via INTRAMUSCULAR

## 2014-01-18 NOTE — Progress Notes (Signed)
   Subjective:    Patient ID: Richard Cordova, male    DOB: 1976-01-18, 38 y.o.   MRN: [id number] Testosterone given IM LUOQ. No complications.  Pt was asking about a "stopping point" where the testosterone is stopped & HcG is given.  I advised him that we don't do those injections and that I would ask you about it. Donne Anon, CMA HPI    Review of Systems     Objective:   Physical Exam        Assessment & Plan:

## 2014-01-18 NOTE — Progress Notes (Signed)
   Subjective:    Patient ID: Richard Cordova, male    DOB: 20-Jul-1976, 38 y.o.   MRN: [id number] Pt notified & does not want a referral to Endocrine.  I advised him that he is due for labs next Friday & asked him to call us on Thursday so we can place the order & fax to lab. Donne Anon, CMA HPI    Review of Systems     Objective:   Physical Exam        Assessment & Plan:

## 2014-01-25 ENCOUNTER — Telehealth: Payer: Self-pay | Admitting: *Deleted

## 2014-01-25 DIAGNOSIS — E785 Hyperlipidemia, unspecified: Secondary | ICD-10-CM

## 2014-01-25 DIAGNOSIS — E349 Endocrine disorder, unspecified: Secondary | ICD-10-CM

## 2014-01-25 NOTE — Telephone Encounter (Signed)
Pt called and left a message that he needs labs drawn. He is due for testosterone labs but also lipid panel is due.I spoke with the patient about this and he is going to come in Monday fasting for all labs. Pt should f/u with [facility] in the near future

## 2014-01-28 LAB — LIPID PANEL
CHOL/HDL RATIO: 5.2 ratio
CHOLESTEROL: 202 mg/dL — AB (ref 0–200)
HDL: 39 mg/dL — ABNORMAL LOW (ref >39)
LDL Cholesterol: 145 mg/dL — ABNORMAL HIGH (ref 0–99)
TRIGLYCERIDES: 92 mg/dL (ref <150)
VLDL: 18 mg/dL (ref 0–40)

## 2014-01-28 LAB — HEMOGLOBIN: HEMOGLOBIN: 17.5 g/dL — AB (ref 13.0–17.0)

## 2014-01-29 ENCOUNTER — Telehealth: Payer: Self-pay | Admitting: Family Medicine

## 2014-01-29 DIAGNOSIS — E785 Hyperlipidemia, unspecified: Secondary | ICD-10-CM

## 2014-01-29 DIAGNOSIS — E349 Endocrine disorder, unspecified: Secondary | ICD-10-CM

## 2014-01-29 LAB — TESTOSTERONE, FREE, TOTAL, SHBG
Sex Hormone Binding: 20 nmol/L (ref 13–71)
Testosterone, Free: 264.4 pg/mL — ABNORMAL HIGH (ref 47.0–244.0)
Testosterone-% Free: 3 % — ABNORMAL HIGH (ref 1.6–2.9)
Testosterone: 896 ng/dL — ABNORMAL HIGH (ref 300–890)

## 2014-01-29 MED ORDER — TESTOSTERONE CYPIONATE 200 MG/ML IM SOLN: INTRAMUSCULAR | Status: DC

## 2014-01-29 MED ORDER — ATORVASTATIN CALCIUM 10 MG PO TABS: 10.0000 mg | ORAL_TABLET | Freq: Every day | ORAL | Status: DC

## 2014-01-29 NOTE — Telephone Encounter (Signed)
Pt.notified

## 2014-01-29 NOTE — Telephone Encounter (Signed)
Sue Lushndrea, Will you please let patient know that his testosterone level and hemoglobin are both above the upper limit of normal.  Hs testosterone dosing regimen is too high.  I'd recommend he continue to receive injections every 3 weeks however lower the dose to 200mg  at every injection. We'll repeat these labs in 3 months.  Cholesterol continues to be elevated and has worsened since being checked in the winter. I'd recommend starting generic Lipitor which I've sent to his [facility] on [facility].  F/U with me in 3 months.

## 2014-02-08 ENCOUNTER — Ambulatory Visit (INDEPENDENT_AMBULATORY_CARE_PROVIDER_SITE_OTHER): Payer: BC Managed Care – PPO | Admitting: Family Medicine

## 2014-02-08 VITALS — BP 128/81 | HR 72 | Ht 72.0 in | Wt 306.0 lb

## 2014-02-08 DIAGNOSIS — E291 Testicular hypofunction: Secondary | ICD-10-CM

## 2014-02-08 MED ORDER — TESTOSTERONE CYPIONATE 200 MG/ML IM SOLN
200.0000 mg | Freq: Once | INTRAMUSCULAR | Status: AC
  Administered 2014-02-08: 200 mg via INTRAMUSCULAR

## 2014-02-08 NOTE — Progress Notes (Signed)
   Subjective:    Patient ID: Richard HammockJohn Cordova, male    DOB: 22-Jul-1976, 38 y.o.   MRN: [id number]  HPI Patient presents today for testosterone injection. Patient received injection on his RUOQ without complication. Patient denies headaches, SOB and mood swings. No other concerns at this time.   Review of Systems     Objective:   Physical Exam        Assessment & Plan:

## 2014-03-04 ENCOUNTER — Ambulatory Visit (INDEPENDENT_AMBULATORY_CARE_PROVIDER_SITE_OTHER): Payer: BC Managed Care – PPO | Admitting: Family Medicine

## 2014-03-04 ENCOUNTER — Encounter: Payer: Self-pay | Admitting: Family Medicine

## 2014-03-04 VITALS — BP 128/83 | HR 97 | Temp 98.0°F | Ht 72.0 in | Wt 313.0 lb

## 2014-03-04 DIAGNOSIS — H109 Unspecified conjunctivitis: Secondary | ICD-10-CM

## 2014-03-04 DIAGNOSIS — R41840 Attention and concentration deficit: Secondary | ICD-10-CM

## 2014-03-04 DIAGNOSIS — H1089 Other conjunctivitis: Secondary | ICD-10-CM

## 2014-03-04 DIAGNOSIS — H66001 Acute suppurative otitis media without spontaneous rupture of ear drum, right ear: Secondary | ICD-10-CM

## 2014-03-04 DIAGNOSIS — A499 Bacterial infection, unspecified: Secondary | ICD-10-CM

## 2014-03-04 DIAGNOSIS — B9689 Other specified bacterial agents as the cause of diseases classified elsewhere: Secondary | ICD-10-CM

## 2014-03-04 DIAGNOSIS — H66009 Acute suppurative otitis media without spontaneous rupture of ear drum, unspecified ear: Secondary | ICD-10-CM

## 2014-03-04 MED ORDER — AMOXICILLIN-POT CLAVULANATE 500-125 MG PO TABS: ORAL_TABLET | ORAL | Status: AC

## 2014-03-04 MED ORDER — POLYMYXIN B-TRIMETHOPRIM 10000-0.1 UNIT/ML-% OP SOLN: 2.0000 [drp] | OPHTHALMIC | Status: DC

## 2014-03-04 NOTE — Progress Notes (Signed)
CC: Lavella HammockJohn Burzynski is a 38 y.o. male is here for Conjunctivitis and URI   Subjective: HPI:  Complains of sore throat, right ear fullness, fatigue, and reddening of the right eye that's been present for the past 1-1/2 weeks. Was initially accompanied by fever of 101.0 however this has not been present for the past week.  Has tried over-the-counter cold medicine without any improvement. Has also tried a five-day course of azithromycin when seen a little over a week ago in urgent care office.  Symptoms overall are moderate in severity nothing particularly makes better or worse. They occur all hours of the day. Denies cough, shortness of breath, choking, difficulty swallowing, drainage from the ear, nasal congestion, facial pressure. Review of systems positive for decreased hearing in the right ear. Denies photophobia nor drainage from the right eye, denies vision loss nor eye pain  Patient requests formal evaluation for ADD   Review Of Systems Outlined In HPI  Past Medical History  Diagnosis Date  . Obesity 07/24/2013    Past Surgical History  Procedure Laterality Date  . Vasectomy  03/2012   Family History  Problem Relation Age of Onset  . Cancer Father     testicular  . Alcoholism Father   . Testicular cancer Father     History   Social History  . Marital Status: Married    Spouse Name: N/A    Number of Children: N/A  . Years of Education: N/A   Occupational History  . Not on file.   Social History Main Topics  . Smoking status: Never Smoker   . Smokeless tobacco: Never Used  . Alcohol Use: Yes  . Drug Use: No  . Sexual Activity: Not on file   Other Topics Concern  . Not on file   Social History Narrative  . No narrative on file     Objective: BP 128/83  Pulse 97  Temp(Src) 98 F (36.7 C)  Ht 6' (1.829 m)  Wt 313 lb (141.976 kg)  BMI 42.44 kg/m2  General: Alert and Oriented, No Acute Distress HEENT: Pupils equal, round, reactive to light. Left Conjunctivae clear  however right conjunctiva are peripherally erythematous sparing the limbus.  External ears unremarkable, canals clear with intact TMs with appropriate landmarks. Left Middle ear appears open without effusion, right middle ear has mild opaque effusion. Pink inferior turbinates.  Moist mucous membranes, pharynx without inflammation nor lesions.  Neck supple without palpable lymphadenopathy nor abnormal masses. Lungs: Clear to auscultation bilaterally, no wheezing/ronchi/rales.  Comfortable work of breathing. Good air movement. Extremities: No peripheral edema.  Strong peripheral pulses.  Mental Status: No depression, anxiety, nor agitation. Skin: Warm and dry.  Assessment & Plan: Richard Cordova was seen today for conjunctivitis and uri.  Diagnoses and associated orders for this visit:  Acute suppurative otitis media of right ear without spontaneous rupture of tympanic membrane, recurrence not specified - amoxicillin-clavulanate (AUGMENTIN) 500-125 MG per tablet; Take one by mouth every 8 hours for ten total days.  Bacterial conjunctivitis - trimethoprim-polymyxin b (POLYTRIM) ophthalmic solution; Place 2 drops into the right eye every 4 (four) hours. For seven days. - Ambulatory referral to Psychiatry  Poor concentration    Right otitis media: Start Augmentin, if no improvement after Wednesday the next step would be to add prednisone Bacterial conjunctivitis: Start Polytrim Poor concentration: Referral to Dr. Marisue BrooklynAmy Stevenson for formal ADD evaluation   Return if symptoms worsen or fail to improve.

## 2014-03-07 ENCOUNTER — Other Ambulatory Visit: Payer: Self-pay | Admitting: Family Medicine

## 2014-03-07 ENCOUNTER — Encounter: Payer: Self-pay | Admitting: Family Medicine

## 2014-03-07 DIAGNOSIS — F900 Attention-deficit hyperactivity disorder, predominantly inattentive type: Secondary | ICD-10-CM | POA: Insufficient documentation

## 2014-03-07 HISTORY — DX: Attention-deficit hyperactivity disorder, predominantly inattentive type: F90.0

## 2014-03-08 ENCOUNTER — Ambulatory Visit: Payer: BC Managed Care – PPO

## 2014-03-12 ENCOUNTER — Ambulatory Visit (INDEPENDENT_AMBULATORY_CARE_PROVIDER_SITE_OTHER): Payer: BC Managed Care – PPO | Admitting: Family Medicine

## 2014-03-12 VITALS — BP 140/90 | HR 94 | Temp 98.4°F | Wt 305.0 lb

## 2014-03-12 DIAGNOSIS — E291 Testicular hypofunction: Secondary | ICD-10-CM | POA: Diagnosis not present

## 2014-03-12 MED ORDER — TESTOSTERONE CYPIONATE 200 MG/ML IM SOLN
200.0000 mg | Freq: Once | INTRAMUSCULAR | Status: AC
  Administered 2014-03-12: 200 mg via INTRAMUSCULAR

## 2014-03-12 NOTE — Progress Notes (Signed)
Pt came in today for his testosterone injection and tolerated well. He received 200 mg in the LUOQ. Denies chest pain, ha, palpitations, SOB, mood changes./Damarien Nyman,CMA

## 2014-03-27 ENCOUNTER — Encounter: Payer: Self-pay | Admitting: Family Medicine

## 2014-04-02 ENCOUNTER — Ambulatory Visit: Payer: BC Managed Care – PPO

## 2014-04-08 ENCOUNTER — Ambulatory Visit (INDEPENDENT_AMBULATORY_CARE_PROVIDER_SITE_OTHER): Payer: BC Managed Care – PPO | Admitting: Family Medicine

## 2014-04-08 VITALS — BP 128/86 | HR 102 | Ht 72.0 in | Wt 304.0 lb

## 2014-04-08 DIAGNOSIS — E291 Testicular hypofunction: Secondary | ICD-10-CM | POA: Diagnosis not present

## 2014-04-08 MED ORDER — TESTOSTERONE CYPIONATE 200 MG/ML IM SOLN
200.0000 mg | Freq: Once | INTRAMUSCULAR | Status: AC
  Administered 2014-04-08: 200 mg via INTRAMUSCULAR

## 2014-04-08 NOTE — Progress Notes (Signed)
   Subjective:    Patient ID: Richard Cordova, male    DOB: May 26, 1976, 38 y.o.   MRN: [id number]  HPI Patient presents today for testosterone injection which he rec'd in his RUOQ with out complication. Patient denies mood swings, CP or SOB. No other concerns at this time. [name], CMA    Review of Systems     Objective:   Physical Exam        Assessment & Plan:

## 2014-04-24 ENCOUNTER — Encounter: Payer: Self-pay | Admitting: Physician Assistant

## 2014-04-24 ENCOUNTER — Ambulatory Visit (INDEPENDENT_AMBULATORY_CARE_PROVIDER_SITE_OTHER): Payer: BC Managed Care – PPO | Admitting: Physician Assistant

## 2014-04-24 VITALS — BP 105/72 | HR 80 | Ht 72.0 in | Wt 307.0 lb

## 2014-04-24 DIAGNOSIS — B880 Other acariasis: Secondary | ICD-10-CM

## 2014-04-24 DIAGNOSIS — B8809 Other acariasis: Secondary | ICD-10-CM

## 2014-04-24 MED ORDER — SUMATRIPTAN SUCCINATE 100 MG PO TABS: 100.0000 mg | ORAL_TABLET | ORAL | Status: DC | PRN

## 2014-04-24 MED ORDER — DIAZEPAM 2 MG PO TABS: 2.0000 mg | ORAL_TABLET | Freq: Two times a day (BID) | ORAL | Status: DC | PRN

## 2014-04-24 MED ORDER — METHYLPREDNISOLONE SODIUM SUCC 125 MG IJ SOLR
125.0000 mg | Freq: Once | INTRAMUSCULAR | Status: AC
Start: 2014-04-24 — End: 2014-04-24
  Administered 2014-04-24: 125 mg via INTRAMUSCULAR

## 2014-04-24 MED ORDER — CLOBETASOL PROPIONATE 0.05 % EX CREA: 1.0000 "application " | TOPICAL_CREAM | Freq: Two times a day (BID) | CUTANEOUS | Status: DC

## 2014-04-24 NOTE — Progress Notes (Signed)
   Subjective:    Patient ID: Richard Cordova, male    DOB: 1976-03-11, 38 y.o.   MRN: [id number]  HPI Pt presents to the clinic with small red dots all over bilateral ankles and up legs along with some on bilateral forearm. Spare trunk and face and around shoes. Camping all weekend. Noticed itching bumps on Monday morning. Tried OTC hydrocortisone did not help. No fever, chills, nausea, vomiting or diarrhea.    Review of Systems  All other systems reviewed and are negative.      Objective:   Physical Exam  Constitutional: He is oriented to person, place, and time. He appears well-developed and well-nourished.  HENT:  Head: Normocephalic and atraumatic.  Cardiovascular: Normal rate, regular rhythm and normal heart sounds.   Pulmonary/Chest: Effort normal and breath sounds normal.  Neurological: He is alert and oriented to person, place, and time.  Skin:  Small red papular spots on bilateral ankles, legs, arms. Sparing trunk.  No vesicles.not open or oozing.   Psychiatric: He has a normal mood and affect. His behavior is normal.          Assessment & Plan:  Chiggers- gave HO on how to prevent. Solumedrol  given IM today. clobetosol for future use or as needed to stop itch. Discussed cold compresses. Benadryl orally for itch.

## 2014-04-30 ENCOUNTER — Ambulatory Visit: Payer: BC Managed Care – PPO

## 2014-05-07 ENCOUNTER — Ambulatory Visit (INDEPENDENT_AMBULATORY_CARE_PROVIDER_SITE_OTHER): Payer: BC Managed Care – PPO | Admitting: Family Medicine

## 2014-05-07 ENCOUNTER — Encounter: Payer: Self-pay | Admitting: Family Medicine

## 2014-05-07 VITALS — BP 123/79 | Wt 298.0 lb

## 2014-05-07 DIAGNOSIS — E291 Testicular hypofunction: Secondary | ICD-10-CM

## 2014-05-07 MED ORDER — TESTOSTERONE CYPIONATE 200 MG/ML IM SOLN
200.0000 mg | Freq: Once | INTRAMUSCULAR | Status: AC
  Administered 2014-05-07: 200 mg via INTRAMUSCULAR

## 2014-05-07 NOTE — Progress Notes (Signed)
   Subjective:    Patient ID: Richard Cordova, male    DOB: [birth year], 38 y.o.   MRN: [id number]  HPI Patient here for a testosterone injection; denies chest pain, shortness of breath, headaches or mood changes.   Review of Systems     Objective:   Physical Exam        Assessment & Plan:  Patient tolerating injection/medication well without complications.Advised patient to schedule his next injection 3 weeks from today.

## 2014-05-28 ENCOUNTER — Ambulatory Visit (INDEPENDENT_AMBULATORY_CARE_PROVIDER_SITE_OTHER): Payer: BC Managed Care – PPO | Admitting: Family Medicine

## 2014-05-28 VITALS — BP 129/88 | HR 80 | Temp 98.4°F | Ht 72.0 in | Wt 303.0 lb

## 2014-05-28 DIAGNOSIS — E349 Endocrine disorder, unspecified: Secondary | ICD-10-CM

## 2014-05-28 DIAGNOSIS — E291 Testicular hypofunction: Secondary | ICD-10-CM

## 2014-05-28 DIAGNOSIS — Z79899 Other long term (current) drug therapy: Secondary | ICD-10-CM

## 2014-05-28 MED ORDER — TESTOSTERONE CYPIONATE 200 MG/ML IM SOLN
200.0000 mg | Freq: Once | INTRAMUSCULAR | Status: AC
  Administered 2014-05-28: 200 mg via INTRAMUSCULAR

## 2014-05-28 NOTE — Progress Notes (Signed)
   Subjective:    Patient ID: Richard HammockJohn Cordova, male    DOB: 05-Jan-1976, 38 y.o.   MRN: [id number]  HPI Richard RuizJohn reports today for testosterone injection which he rec'd without complication. He denies CP, SOB or mood swings at this time. No other complications or GU symptoms. Richard SkainsJamie Vianne Cordova, CMA    Review of Systems     Objective:   Physical Exam        Assessment & Plan:  Advised to schedule next testosterone for 3 weeks.

## 2014-05-28 NOTE — Progress Notes (Signed)
Richard Cordova, Will you please let patient know that hemoglobin checked within the next 2 weeks. Ideally this should be done between today and his next testosterone injection. I printed off a lab slip for this and placed a in your in box.

## 2014-05-28 NOTE — Progress Notes (Signed)
Pt.notified

## 2014-06-18 ENCOUNTER — Ambulatory Visit (INDEPENDENT_AMBULATORY_CARE_PROVIDER_SITE_OTHER): Payer: BC Managed Care – PPO | Admitting: Family Medicine

## 2014-06-18 VITALS — BP 111/72 | HR 69 | Wt 306.0 lb

## 2014-06-18 DIAGNOSIS — E291 Testicular hypofunction: Secondary | ICD-10-CM | POA: Diagnosis not present

## 2014-06-18 MED ORDER — TESTOSTERONE CYPIONATE 200 MG/ML IM SOLN
200.0000 mg | Freq: Once | INTRAMUSCULAR | Status: AC
  Administered 2014-06-18: 200 mg via INTRAMUSCULAR

## 2014-06-18 NOTE — Progress Notes (Signed)
   Subjective:    Patient ID: Richard Cordova, male    DOB: 1975/09/10, 38 y.o.   MRN: [id number]  HPI  Richard Cordova is here for a testosterone injection. Denies chest pain, shortness of breath, headaches or mood changes.   Review of Systems     Objective:   Physical Exam        Assessment & Plan:  Patient tolerated injection well without complications. Patient advised to schedule next injection 21 days from today.

## 2014-07-09 ENCOUNTER — Ambulatory Visit: Payer: BC Managed Care – PPO

## 2014-07-24 ENCOUNTER — Ambulatory Visit (INDEPENDENT_AMBULATORY_CARE_PROVIDER_SITE_OTHER): Payer: BC Managed Care – PPO | Admitting: Family Medicine

## 2014-07-24 VITALS — BP 115/66 | HR 64 | Ht 72.0 in | Wt 308.0 lb

## 2014-07-24 DIAGNOSIS — E291 Testicular hypofunction: Secondary | ICD-10-CM

## 2014-07-24 DIAGNOSIS — Z79899 Other long term (current) drug therapy: Secondary | ICD-10-CM

## 2014-07-24 DIAGNOSIS — E349 Endocrine disorder, unspecified: Secondary | ICD-10-CM

## 2014-07-24 MED ORDER — TESTOSTERONE CYPIONATE 100 MG/ML IM SOLN
200.0000 mg | Freq: Once | INTRAMUSCULAR | Status: AC
  Administered 2014-07-24: 200 mg via INTRAMUSCULAR

## 2014-07-24 NOTE — Progress Notes (Signed)
Spoke with Richard RuizJohn and he will pick up lab slip and have it completed around 08/02/14. Corliss SkainsJamie Celica Cordova, CMA

## 2014-07-24 NOTE — Progress Notes (Signed)
   Subjective:    Patient ID: Richard HammockJohn Cordova, male    DOB: September 05, 1975, 38 y.o.   MRN: [id number]  HPI  Richard RuizJohn presents today for testosterone injection which he received without complication. He denies CP, SOB or mood swings associated with injection. No other GU concerns at this time. Corliss SkainsJamie [name], CMA    Review of Systems     Objective:   Physical Exam        Assessment & Plan:

## 2014-07-24 NOTE — Progress Notes (Signed)
Asher MuirJamie, Will you please let patient know that he is due to have testosterone, PSA, and Hgb checked before his next injection.  Ideally obtained in one and a half weeks from today.  Lab slips in your "PA"  inbox.

## 2014-08-21 ENCOUNTER — Ambulatory Visit: Payer: BC Managed Care – PPO

## 2014-10-01 ENCOUNTER — Other Ambulatory Visit: Payer: Self-pay | Admitting: Family Medicine

## 2015-10-12 ENCOUNTER — Encounter: Payer: Self-pay | Admitting: Emergency Medicine

## 2015-10-12 ENCOUNTER — Emergency Department
Admission: EM | Admit: 2015-10-12 | Discharge: 2015-10-12 | Disposition: A | Discharge status: Home / Self Care | Payer: BLUE CROSS/BLUE SHIELD | Source: Home / Self Care | Attending: Family Medicine | Admitting: Family Medicine

## 2015-10-12 DIAGNOSIS — J0100 Acute maxillary sinusitis, unspecified: Secondary | ICD-10-CM

## 2015-10-12 MED ORDER — AMOXICILLIN-POT CLAVULANATE 875-125 MG PO TABS: 1.0000 | ORAL_TABLET | Freq: Two times a day (BID) | ORAL | Status: DC

## 2015-10-12 MED ORDER — FLUTICASONE PROPIONATE 50 MCG/ACT NA SUSP: 2.0000 | Freq: Every day | NASAL | Status: DC

## 2015-10-12 NOTE — ED Provider Notes (Signed)
CSN: [id number]     Arrival date & time 10/12/15  1106 History   First MD Initiated Contact with Patient 10/12/15 1145     Chief Complaint  Patient presents with  . Sinusitis   (Consider location/radiation/quality/duration/timing/severity/associated sxs/prior Treatment) HPI  The pt is a 40yo male presenting to [facility] with c/o sinus pressure and headache with sinus congestion for 7 days.  Symptoms are gradually worsening. He has been taking Advil as well as his Zyrtec but only minimal relief. Headache is aching and sore, 2/10 at worst. Associated bilateral ear pressure. His 3yo son was recently dx with a an ear infection. He has had subjective fevers. Denies n/v/d. Denies cough, chest pain or SOB.   Past Medical History  Diagnosis Date  . Obesity 07/24/2013   Past Surgical History  Procedure Laterality Date  . Vasectomy  03/2012   Family History  Problem Relation Age of Onset  . Cancer Father     testicular  . Alcoholism Father   . Testicular cancer Father    Social History  Substance Use Topics  . Smoking status: Never Smoker   . Smokeless tobacco: Never Used  . Alcohol Use: No    Review of Systems  Constitutional: Positive for fever, chills and fatigue.  HENT: Positive for congestion, ear pain, nosebleeds, rhinorrhea, sinus pressure and sneezing. Negative for sore throat, trouble swallowing and voice change.   Respiratory: Negative for cough and shortness of breath.   Cardiovascular: Negative for chest pain and palpitations.  Gastrointestinal: Negative for nausea, vomiting, abdominal pain and diarrhea.  Musculoskeletal: Positive for myalgias and arthralgias. Negative for back pain.  Skin: Negative for rash.  Neurological: Positive for headaches. Negative for dizziness and light-headedness.    Allergies  Review of patient's allergies indicates no known allergies.  Home Medications   Prior to Admission medications   Medication Sig Start Date End Date Taking? Authorizing  Provider  albuterol (PROVENTIL HFA;VENTOLIN HFA) 108 (90 BASE) MCG/ACT inhaler Inhale two puffs every 4-6 hours only as needed for shortness of breath or wheezing. 12/14/13 12/14/14  Laren Boom, DO  amoxicillin-clavulanate (AUGMENTIN) 875-125 MG tablet Take 1 tablet by mouth 2 (two) times daily. One po bid x 7 days 10/12/15   Junius Finner, PA-C  amphetamine-dextroamphetamine (ADDERALL XR) 20 MG 24 hr capsule Take 1 capsule (20 mg total) by mouth every morning. 10/01/14   Laren Boom, DO  cetirizine (ZYRTEC) 10 MG tablet Take 10 mg by mouth daily.    Historical Provider, MD  clobetasol cream (TEMOVATE) 0.05 % Apply 1 application topically 2 (two) times daily. 04/24/14   Jade L Breeback, PA-C  fluticasone (FLONASE) 50 MCG/ACT nasal spray Place 2 sprays into both nostrils daily. Daily for at least 2 weeks, then seasonally as needed 10/12/15   Junius Finner, PA-C  ibuprofen (ADVIL,MOTRIN) 200 MG tablet Take 800 mg by mouth every 6 (six) hours as needed. For pain    Historical Provider, MD  testosterone cypionate (DEPOTESTOTERONE CYPIONATE) 200 MG/ML injection (Keep in med history)  IM Q 3 weeks given in clinic. 01/29/14   Laren Boom, DO   Meds Ordered and Administered this Visit  Medications - No data to display  BP 128/80 mmHg  Pulse 71  Temp(Src) 97.8 F (36.6 C) (Oral)  Ht  (1.778 m)  Wt 310 lb 8 oz (140.842 kg)  BMI 44.55 kg/m2  SpO2 96% No data found.   Physical Exam  Constitutional: He appears well-developed and well-nourished.  HENT:  Head: Normocephalic  and atraumatic.  Right Ear: Tympanic membrane normal.  Left Ear: Tympanic membrane normal.  Nose: Mucosal edema present. Right sinus exhibits maxillary sinus tenderness. Right sinus exhibits no frontal sinus tenderness. Left sinus exhibits maxillary sinus tenderness. Left sinus exhibits no frontal sinus tenderness.  Mouth/Throat: Uvula is midline, oropharynx is clear and moist and mucous membranes are normal.  Eyes:  Conjunctivae are normal. No scleral icterus.  Neck: Normal range of motion. Neck supple.  Cardiovascular: Normal rate, regular rhythm and normal heart sounds.   Pulmonary/Chest: Effort normal and breath sounds normal. No stridor. No respiratory distress. He has no wheezes. He has no rales. He exhibits no tenderness.  Musculoskeletal: Normal range of motion.  Neurological: He is alert.  Skin: Skin is warm and dry.  Nursing note and vitals reviewed.   ED Course  Procedures (including critical care time)  Labs Review Labs Reviewed - No data to display  Imaging Review No results found.    MDM   1. Acute maxillary sinusitis, recurrence not specified    Exam c/w sinusitis.   Rx: Augmentin and Flonase  Advised pt to use acetaminophen and ibuprofen as needed for fever and pain. Encouraged rest and fluids. F/u with PCP in 7-10 days if not improving, sooner if worsening. Pt verbalized understanding and agreement with tx plan.     Junius Finner, PA-C 10/12/15 1157

## 2015-10-12 NOTE — ED Notes (Signed)
Patient precent to [facility] with C/O sinus headache and facial pressure times 7 days. Also advised nose bleeds, rates headache pain 2/10 with facial pain

## 2015-10-12 NOTE — Discharge Instructions (Signed)
You may take 400-600mg Ibuprofen (Motrin) every 6-8 hours for fever and pain  °Alternate with Tylenol  °You may take 500mg Tylenol every 4-6 hours as needed for fever and pain  °Follow-up with your primary care provider next week for recheck of symptoms if not improving.  °Be sure to drink plenty of fluids and rest, at least 8hrs of sleep a night, preferably more while you are sick. °Return urgent care or go to closest ER if you cannot keep down fluids/signs of dehydration, fever not reducing with Tylenol, difficulty breathing/wheezing, stiff neck, worsening condition, or other concerns (see below)  °Please take antibiotics as prescribed and be sure to complete entire course even if you start to feel better to ensure infection does not come back. ° ° °Sinus Rinse °WHAT IS A SINUS RINSE? °A sinus rinse is a simple home treatment that is used to rinse your sinuses with a sterile mixture of salt and water (saline solution). Sinuses are air-filled spaces in your skull behind the bones of your face and forehead that open into your nasal cavity. °You will use the following: °· Saline solution. °· Neti pot or spray bottle. This releases the saline solution into your nose and through your sinuses. Neti pots and spray bottles can be purchased at your local pharmacy, a health food store, or online. °WHEN WOULD I DO A SINUS RINSE? °A sinus rinse can help to clear mucus, dirt, dust, or pollen from the nasal cavity. You may do a sinus rinse when you have a cold, a virus, nasal allergy symptoms, a sinus infection, or stuffiness in the nose or sinuses. °If you are considering a sinus rinse: °· Ask your child's health care provider before performing a sinus rinse on your child. °· Do not do a sinus rinse if you have had ear or nasal surgery, ear infection, or blocked ears. °HOW DO I DO A SINUS RINSE? °· Wash your hands. °· Disinfect your device according to the directions provided and then dry it. °· Use the solution that comes  with your device or one that is sold separately in stores. Follow the mixing directions on the package. °· Fill your device with the amount of saline solution as directed by the device instructions. °· Stand over a sink and tilt your head sideways over the sink. °· Place the spout of the device in your upper nostril (the one closer to the ceiling). °· Gently pour or squeeze the saline solution into the nasal cavity. The liquid should drain to the lower nostril if you are not overly congested. °· Gently blow your nose. Blowing too hard may cause ear pain. °· Repeat in the other nostril. °· Clean and rinse your device with clean water and then air-dry it. °ARE THERE RISKS OF A SINUS RINSE?  °Sinus rinse is generally very safe and effective. However, there are a few risks, which include:  °· A burning sensation in the sinuses. This may happen if you do not make the saline solution as directed. Make sure to follow all directions when making the saline solution. °· Infection from contaminated water. This is rare, but possible. °· Nasal irritation. °  °This information is not intended to replace advice given to you by your health care provider. Make sure you discuss any questions you have with your health care provider. °  °Document Released: 02/27/2014 Document Reviewed: 02/27/2014 °Elsevier Interactive Patient Education ©2016 [employer] ° °Sinusitis, Adult °Sinusitis is redness, soreness, and inflammation of the paranasal sinuses.   Paranasal sinuses are air pockets within the bones of your face. They are located beneath your eyes, in the middle of your forehead, and above your eyes. In healthy paranasal sinuses, mucus is able to drain out, and air is able to circulate through them by way of your nose. However, when your paranasal sinuses are inflamed, mucus and air can become trapped. This can allow bacteria and other germs to grow and cause infection. °Sinusitis can develop quickly and last only a short time (acute)  or continue over a long period (chronic). Sinusitis that lasts for more than 12 weeks is considered chronic. °CAUSES °Causes of sinusitis include: °· Allergies. °· Structural abnormalities, such as displacement of the cartilage that separates your nostrils (deviated septum), which can decrease the air flow through your nose and sinuses and affect sinus drainage. °· Functional abnormalities, such as when the small hairs (cilia) that line your sinuses and help remove mucus do not work properly or are not present. °SIGNS AND SYMPTOMS °Symptoms of acute and chronic sinusitis are the same. The primary symptoms are pain and pressure around the affected sinuses. Other symptoms include: °· Upper toothache. °· Earache. °· Headache. °· Bad breath. °· Decreased sense of smell and taste. °· A cough, which worsens when you are lying flat. °· Fatigue. °· Fever. °· Thick drainage from your nose, which often is green and may contain pus (purulent). °· Swelling and warmth over the affected sinuses. °DIAGNOSIS °Your health care provider will perform a physical exam. During your exam, your health care provider may perform any of the following to help determine if you have acute sinusitis or chronic sinusitis: °· Look in your nose for signs of abnormal growths in your nostrils (nasal polyps). °· Tap over the affected sinus to check for signs of infection. °· View the inside of your sinuses using an imaging device that has a light attached (endoscope). °If your health care provider suspects that you have chronic sinusitis, one or more of the following tests may be recommended: °· Allergy tests. °· Nasal culture. A sample of mucus is taken from your nose, sent to a lab, and screened for bacteria. °· Nasal cytology. A sample of mucus is taken from your nose and examined by your health care provider to determine if your sinusitis is related to an allergy. °TREATMENT °Most cases of acute sinusitis are related to a viral infection and will  resolve on their own within 10 days. Sometimes, medicines are prescribed to help relieve symptoms of both acute and chronic sinusitis. These may include pain medicines, decongestants, nasal steroid sprays, or saline sprays. °However, for sinusitis related to a bacterial infection, your health care provider will prescribe antibiotic medicines. These are medicines that will help kill the bacteria causing the infection. °Rarely, sinusitis is caused by a fungal infection. In these cases, your health care provider will prescribe antifungal medicine. °For some cases of chronic sinusitis, surgery is needed. Generally, these are cases in which sinusitis recurs more than 3 times per year, despite other treatments. °HOME CARE INSTRUCTIONS °· Drink plenty of water. Water helps thin the mucus so your sinuses can drain more easily. °· Use a humidifier. °· Inhale steam 3-4 times a day (for example, sit in the bathroom with the shower running). °· Apply a warm, moist washcloth to your face 3-4 times a day, or as directed by your health care provider. °· Use saline nasal sprays to help moisten and clean your sinuses. °· Take medicines only as directed   by your health care provider. °· If you were prescribed either an antibiotic or antifungal medicine, finish it all even if you start to feel better. °SEEK IMMEDIATE MEDICAL CARE IF: °· You have increasing pain or severe headaches. °· You have nausea, vomiting, or drowsiness. °· You have swelling around your face. °· You have vision problems. °· You have a stiff neck. °· You have difficulty breathing. °  °This information is not intended to replace advice given to you by your health care provider. Make sure you discuss any questions you have with your health care provider. °  °Document Released: 08/02/2005 Document Revised: 08/23/2014 Document Reviewed: 08/17/2011 °Elsevier Interactive Patient Education ©2016 Elsevier Inc. ° ° °

## 2016-05-11 ENCOUNTER — Ambulatory Visit (INDEPENDENT_AMBULATORY_CARE_PROVIDER_SITE_OTHER): Payer: BLUE CROSS/BLUE SHIELD | Admitting: Osteopathic Medicine

## 2016-05-11 ENCOUNTER — Encounter: Payer: Self-pay | Admitting: Osteopathic Medicine

## 2016-05-11 VITALS — BP 113/69 | HR 59 | Ht 71.0 in | Wt 311.0 lb

## 2016-05-11 DIAGNOSIS — S39012A Strain of muscle, fascia and tendon of lower back, initial encounter: Secondary | ICD-10-CM

## 2016-05-11 MED ORDER — NAPROXEN 500 MG PO TABS: 500.0000 mg | ORAL_TABLET | Freq: Two times a day (BID) | ORAL | 0 refills | Status: DC

## 2016-05-11 MED ORDER — CYCLOBENZAPRINE HCL 10 MG PO TABS: 10.0000 mg | ORAL_TABLET | Freq: Three times a day (TID) | ORAL | 1 refills | Status: DC | PRN

## 2016-05-11 NOTE — Progress Notes (Signed)
HPI: Richard Cordova is a 40 y.o. male  who [facility], 05[facility]/16,  for chief complaint of:  Chief Complaint  Patient presents with  . Back Pain   . Context:No injury that he can recall, was doing yardwork day prior to onset of pain . Location: Lower back on the left . Quality: Crampy/sore . Duration: 1-2 days . Assoc signs/symptoms: No numbness or tingling in the legs   Past medical, surgical, social and family history reviewed: Past Medical History:  Diagnosis Date  . Obesity 07/24/2013   Past Surgical History:  Procedure Laterality Date  . VASECTOMY  03/2012   Social History  Substance Use Topics  . Smoking status: Never Smoker  . Smokeless tobacco: Never Used  . Alcohol use No   Family History  Problem Relation Age of Onset  . Cancer Father     testicular  . Alcoholism Father   . Testicular cancer Father      Current medication list and allergy/intolerance information reviewed:   Current Outpatient Prescriptions  Medication Sig Dispense Refill  . albuterol (PROVENTIL HFA;VENTOLIN HFA) 108 (90 BASE) MCG/ACT inhaler Inhale two puffs every 4-6 hours only as needed for shortness of breath or wheezing. 1 Inhaler 1  . amoxicillin-clavulanate (AUGMENTIN) 875-125 MG tablet Take 1 tablet by mouth 2 (two) times daily. One po bid x 7 days 14 tablet 0  . amphetamine-dextroamphetamine (ADDERALL XR) 20 MG 24 hr capsule Take 1 capsule (20 mg total) by mouth every morning. 30 capsule 0  . cetirizine (ZYRTEC) 10 MG tablet Take 10 mg by mouth daily.    . clobetasol cream (TEMOVATE) 0.05 % Apply 1 application topically 2 (two) times daily. 60 g 0  . fluticasone (FLONASE) 50 MCG/ACT nasal spray Place 2 sprays into both nostrils daily. Daily for at least 2 weeks, then seasonally as needed 9.9 g 2  . ibuprofen (ADVIL,MOTRIN) 200 MG tablet Take 800 mg by mouth every 6 (six) hours as needed. For pain    . testosterone cypionate (DEPOTESTOTERONE  CYPIONATE) 200 MG/ML injection (Keep in med history) 200mg  IM Q 3 weeks given in clinic. 10 mL 0   No current facility-administered medications for this visit.    No Known Allergies    Review of Systems:  Constitutional:  No  fever, no chills, No recent illnes  Cardiac: No  chest pain,  Respiratory:  No  shortness of breath.   Gastrointestinal: No  abdominal pain  Musculoskeletal: + new myalgia/arthralgia  Neurologic: No  Weakness, no shooting pain   Exam:  BP 113/69   Pulse (!) 59   Ht 5\' 11"  (1.803 m)   Wt (!) 311 lb (141.1 kg)   BMI 43.38 kg/m   Constitutional: VS see above. General Appearance: alert, well-developed, well-nourished, NAD  Ears, Nose, Mouth, Throat: MMM, Normal external inspection ears/nares/mouth/lips/gums.   Neck: No masses, trachea midline.   Respiratory: Normal respiratory effort. n  Musculoskeletal: Obese abdomen. Gait normal. No clubbing/cyanosis of digits. Tenderness to quadratus lumborum area on the left, limited range of motion to forward flexion, normal range of motion to side bending, rotation, extension. Negative straight leg raise bilaterally  Neurological: Normal balance/coordination. No tremor.   Skin: warm, dry, intact.   Psychiatric: Normal judgment/insight. Normal mood and affect. Oriented x3.    BMP 2014 normal renal function.  ASSESSMENT/PLAN:   Low back strain, initial encounter - Plan: naproxen (NAPROSYN) 500 MG tablet, cyclobenzaprine (FLEXERIL) 10 MG tablet   Patient  Instructions  Plan: Anti-inflammatory naproxen and muscle relaxer cyclobenzaprine for relief of pain. Instructions were printed for home exercises, please be diligent about doing these 2-3 times per day as tolerated. Home exercises and medications are not helping you after 1-2 weeks, please call us and we will send a referral to physical therapy. Depending on your progress with physical therapy if needed, they will tell you when he should come back to see  [place]. Please let us know if any other questions or concerns. Please let us know if you're back is not doing better after 1 month    Visit summary with medication list and pertinent instructions was printed for patient to review. All questions at time of visit were answered - patient instructed to contact office with any additional concerns. ER/RTC precautions were reviewed with the patient. Follow-up plan: Return if symptoms worsen or fail to improve her back pain, and as convenient for routine annual physic.

## 2016-05-11 NOTE — Patient Instructions (Signed)
Plan: Anti-inflammatory naproxen and muscle relaxer cyclobenzaprine for relief of pain. Instructions were printed for home exercises, please be diligent about doing these 2-3 times per day as tolerated. Home exercises and medications are not helping you after 1-2 weeks, please call us and we will send a referral to physical therapy. Depending on your progress with physical therapy if needed, they will tell you when he should come back to see us. Please let us know if any other questions or concerns. Please let us know if you're back is not doing better after 1 month

## 2016-10-08 DIAGNOSIS — D485 Neoplasm of uncertain behavior of skin: Secondary | ICD-10-CM | POA: Diagnosis not present

## 2016-10-08 DIAGNOSIS — L821 Other seborrheic keratosis: Secondary | ICD-10-CM | POA: Diagnosis not present

## 2017-03-07 ENCOUNTER — Other Ambulatory Visit: Payer: Self-pay | Admitting: Physician Assistant

## 2017-03-07 ENCOUNTER — Encounter: Payer: Self-pay | Admitting: Physician Assistant

## 2017-03-07 ENCOUNTER — Ambulatory Visit (INDEPENDENT_AMBULATORY_CARE_PROVIDER_SITE_OTHER): Payer: BLUE CROSS/BLUE SHIELD | Admitting: Physician Assistant

## 2017-03-07 VITALS — BP 130/87 | HR 75 | Temp 98.0°F | Ht 71.0 in | Wt 339.0 lb

## 2017-03-07 DIAGNOSIS — E349 Endocrine disorder, unspecified: Secondary | ICD-10-CM | POA: Diagnosis not present

## 2017-03-07 DIAGNOSIS — Z6841 Body Mass Index (BMI) 40.0 and over, adult: Secondary | ICD-10-CM

## 2017-03-07 DIAGNOSIS — Z9189 Other specified personal risk factors, not elsewhere classified: Secondary | ICD-10-CM | POA: Diagnosis not present

## 2017-03-07 DIAGNOSIS — Z131 Encounter for screening for diabetes mellitus: Secondary | ICD-10-CM

## 2017-03-07 DIAGNOSIS — F332 Major depressive disorder, recurrent severe without psychotic features: Secondary | ICD-10-CM | POA: Diagnosis not present

## 2017-03-07 DIAGNOSIS — R0602 Shortness of breath: Secondary | ICD-10-CM

## 2017-03-07 DIAGNOSIS — F900 Attention-deficit hyperactivity disorder, predominantly inattentive type: Secondary | ICD-10-CM

## 2017-03-07 DIAGNOSIS — Z13220 Encounter for screening for lipoid disorders: Secondary | ICD-10-CM | POA: Diagnosis not present

## 2017-03-07 DIAGNOSIS — R0683 Snoring: Secondary | ICD-10-CM

## 2017-03-07 DIAGNOSIS — Z7689 Persons encountering health services in other specified circumstances: Secondary | ICD-10-CM

## 2017-03-07 DIAGNOSIS — R7989 Other specified abnormal findings of blood chemistry: Secondary | ICD-10-CM | POA: Diagnosis not present

## 2017-03-07 DIAGNOSIS — E291 Testicular hypofunction: Secondary | ICD-10-CM | POA: Diagnosis not present

## 2017-03-07 MED ORDER — VILAZODONE HCL 10 & 20 MG PO KIT: 1.0000 | PACK | Freq: Every morning | ORAL | 0 refills | Status: DC

## 2017-03-07 MED ORDER — AMPHETAMINE-DEXTROAMPHET ER 10 MG PO CP24: 10.0000 mg | ORAL_CAPSULE | Freq: Every day | ORAL | 0 refills | Status: DC

## 2017-03-07 NOTE — Progress Notes (Signed)
HPI:                                                                Richard Cordova is a 41 y.o. male who presents to [facility] today to establish care   Current Concerns include depression  HPI  Depression/Anxiety: patient reports depressed mood and anhedonia, worsening over the last several months. Symptoms are constant and interfere with daily activities. They are aggravated by work stress. Endorses poor sleep quality and several nighttime awakenings. Also snores. Denies symptoms of mania/hypomania. Denies suicidal thinking. Denies auditory/visual hallucinations.  ADHD: diagnosed by Dr. Johnnye Sima. Most recently was on Adderall XR (09/2014). States this worked well for him and he would like to re-start. Endorses inattention. States he has gained 60 pounds since being off of this medication.  Hypotestosteronism: has been on depotestosterone in the past. Self-discontinued due to testicular atrophy  Health Maintenance Health Maintenance  Topic Date Due  . HIV Screening  06/01/1992  . TETANUS/TDAP  06/01/1996  . INFLUENZA VACCINE  05/11/2017 (Originally 03/16/2017)    Past Medical History:  Diagnosis Date  . ADHD (attention deficit hyperactivity disorder), inattentive type 03/07/2014   Dr. Johnnye Sima - Started on Vyvanse 6m July 2015, additional 159min the afternoon Aug 2015, Switched to Concerta Nov 200037aused low libido, changed to Adderall XR 2065mebruary 2016   . Fatigue 07/24/2013  . Hyperlipidemia 08/09/2013   Lipitor started June 2015   . Hypotestosteronism 07/26/2013   Dose lowered June 2015, Labs Due Jan 2016   . MRSA infection 07/24/2013  . Obesity 07/24/2013   Past Surgical History:  Procedure Laterality Date  . VASECTOMY  03/2012   Social History  Substance Use Topics  . Smoking status: Never Smoker  . Smokeless tobacco: Never Used  . Alcohol use No   family history includes Alcoholism in his father and mother; Cancer in  his father; Depression in his father and mother; Sleep apnea in his father; Testicular cancer in his father.  ROS: negative except as noted in the HPI  Medications: Current Outpatient Prescriptions  Medication Sig Dispense Refill  . albuterol (PROVENTIL HFA;VENTOLIN HFA) 108 (90 BASE) MCG/ACT inhaler Inhale two puffs every 4-6 hours only as needed for shortness of breath or wheezing. 1 Inhaler 1  . amphetamine-dextroamphetamine (ADDERALL XR) 10 MG 24 hr capsule Take 1 capsule (10 mg total) by mouth daily. 30 capsule 0  . cetirizine (ZYRTEC) 10 MG tablet Take 10 mg by mouth daily.    . cyclobenzaprine (FLEXERIL) 10 MG tablet Take 1 tablet (10 mg total) by mouth 3 (three) times daily as needed for muscle spasms. 30 tablet 1  . fluticasone (FLONASE) 50 MCG/ACT nasal spray Place 2 sprays into both nostrils daily. Daily for at least 2 weeks, then seasonally as needed 9.9 g 2  . ibuprofen (ADVIL,MOTRIN) 200 MG tablet Take 800 mg by mouth every 6 (six) hours as needed. For pain    . testosterone cypionate (DEPOTESTOTERONE CYPIONATE) 200 MG/ML injection (Keep in med history) 200m19m Q 3 weeks given in clinic. 10 mL 0  . Vilazodone HCl (VIIBRYD STARTER PACK) 10 & 20 MG KIT Take 1 tablet by mouth every morning. Use as directed, 4 weeks QS 1 kit 0  No current facility-administered medications for this visit.    No Known Allergies     Objective:  BP 130/87 (BP Location: Left Arm, Patient Position: Sitting, Cuff Size: Large)   Pulse 75   Temp 98 F (36.7 C) (Oral)   Ht 5' 11"  (1.803 m)   Wt (!) 339 lb (153.8 kg)   SpO2 99%   BMI 47.28 kg/m  Gen: well-groomed, cooperative, morbidly obese, not ill-appearing, no distress HEENT: normal conjunctiva, no thyromegaly or tenderness, trachea midline Pulm: Normal work of breathing, normal phonation, clear to auscultation bilaterally CV: Normal rate, regular rhythm, s1 and s2 distinct, no murmurs, clicks or rubs, no carotid bruit Neuro: alert and  oriented x 3, EOM's intact, normal tone, no tremor MSK: moving all extremities, normal gait and station, no peripheral edema Skin: warm and dry, no rashes or lesions on exposed skin Psych: good eye contact, depressed mood, affect mood-congruent, normal speech and thought content   No results found for this or any previous visit (from the past 72 hour(s)). No results found.  Depression screen [facility] 2/9 03/07/2017  Decreased Interest 3  Down, Depressed, Hopeless 3  PHQ - 2 Score 6  Altered sleeping 3  Tired, decreased energy 3  Change in appetite 3  Feeling bad or failure about yourself  3  Trouble concentrating 3  Moving slowly or fidgety/restless 0  Suicidal thoughts 0  PHQ-9 Score 21   No flowsheet data found.    Assessment and Plan: 41 y.o. male with   1. Encounter to establish care - reviewed PMH - depression screening  2. Hypotestosteronism - checking FSH and LH to assess for secondary hypogonadism - patient desires treatment - Testosterone Total,Free,Bio, Males - PSA - CBC - Comprehensive metabolic panel - Follicle stimulating hormone - Luteinizing hormone  3. ADHD (attention deficit hyperactivity disorder), inattentive type - I think patient's mood will benefit from restarting his Adderall. No CVD. Will continue to monitor BP and HR - amphetamine-dextroamphetamine (ADDERALL XR) 10 MG 24 hr capsule; Take 1 capsule (10 mg total) by mouth daily.  Dispense: 30 capsule; Refill: 0  4. Shortness of breath - likely secondary to morbid obesity and deconditioning - will monitor for signs of heart failure  5. At risk for obstructive sleep apnea - positive STOPBANG - Home sleep test; Future  6. Severe episode of recurrent major depressive disorder, without psychotic features ([place]) - PHQ9 score 21, severe. No SI - Vilazodone HCl (VIIBRYD STARTER PACK) 10 & 20 MG KIT; Take 1 tablet by mouth every morning. Use as directed, 4 weeks QS  Dispense: 1 kit; Refill: 0  7.  Snoring - Home sleep test; Future  8. Encounter for screening for lipid disorder - Lipid Panel w/reflex Direct LDL  9. Encounter for screening examination for impaired glucose regulation and diabetes mellitus - Hemoglobin A1c  Patient education and anticipatory guidance given Patient agrees with treatment plan Follow-up in 4 weeks or sooner as needed  Darlyne Russian PA-C

## 2017-03-07 NOTE — Patient Instructions (Addendum)
-   Start Viibryd starter pack for depression/anxiety - Re-start your Adderall 1 capsule every morning. Monitor your blood pressure  For your blood pressure: - Check blood pressure at home and log your readings - Check around the same time each day in a relaxed setting - Limit salt. Follow DASH eating plan - Follow-up in 4 weeks   - Return for fasting labs between 7:30a-8:30a. Nothing to eat or drink except water and your medications  - You will be contacted about scheduling your home sleep study

## 2017-03-08 ENCOUNTER — Telehealth: Payer: Self-pay | Admitting: *Deleted

## 2017-03-08 LAB — LIPID PANEL W/REFLEX DIRECT LDL
CHOL/HDL RATIO: 4.5 ratio (ref <5.0)
Cholesterol: 217 mg/dL — ABNORMAL HIGH (ref <200)
HDL: 48 mg/dL (ref >40)
LDL-Cholesterol: 148 mg/dL — ABNORMAL HIGH
Non-HDL Cholesterol (Calc): 169 mg/dL — ABNORMAL HIGH (ref <130)
Triglycerides: 97 mg/dL (ref <150)

## 2017-03-08 LAB — COMPREHENSIVE METABOLIC PANEL
ALT: 31 U/L (ref 9–46)
AST: 21 U/L (ref 10–40)
Albumin: 4.2 g/dL (ref 3.6–5.1)
Alkaline Phosphatase: 74 U/L (ref 40–115)
BUN: 15 mg/dL (ref 7–25)
CHLORIDE: 104 mmol/L (ref 98–110)
CO2: 23 mmol/L (ref 20–31)
CREATININE: 0.91 mg/dL (ref 0.60–1.35)
Calcium: 9.2 mg/dL (ref 8.6–10.3)
Glucose, Bld: 90 mg/dL (ref 65–99)
Potassium: 4.6 mmol/L (ref 3.5–5.3)
SODIUM: 138 mmol/L (ref 135–146)
TOTAL PROTEIN: 6.9 g/dL (ref 6.1–8.1)
Total Bilirubin: 0.9 mg/dL (ref 0.2–1.2)

## 2017-03-08 LAB — CBC
HCT: 43.2 % (ref 38.5–50.0)
Hemoglobin: 14.9 g/dL (ref 13.2–17.1)
MCH: 28.6 pg (ref 27.0–33.0)
MCHC: 34.5 g/dL (ref 32.0–36.0)
MCV: 82.9 fL (ref 80.0–100.0)
MPV: 10.5 fL (ref 7.5–12.5)
PLATELETS: 243 10*3/uL (ref 140–400)
RBC: 5.21 MIL/uL (ref 4.20–5.80)
RDW: 13.2 % (ref 11.0–15.0)
WBC: 8.1 10*3/uL (ref 3.8–10.8)

## 2017-03-08 NOTE — Telephone Encounter (Signed)
Pre Authorization sent to cover my meds. [id number] Most likely this will be denied as patient has not tried any other antidepressants/anxiety meds besides valium

## 2017-03-09 ENCOUNTER — Encounter: Payer: Self-pay | Admitting: Physician Assistant

## 2017-03-09 DIAGNOSIS — E291 Testicular hypofunction: Secondary | ICD-10-CM

## 2017-03-09 HISTORY — DX: Testicular hypofunction: E29.1

## 2017-03-09 LAB — TESTOSTERONE TOTAL,FREE,BIO, MALES
ALBUMIN: 4.2 g/dL (ref 3.6–5.1)
SEX HORMONE BINDING: 24 nmol/L (ref 10–50)
Testosterone: 179 ng/dL — ABNORMAL LOW (ref 250–827)

## 2017-03-09 LAB — PSA: PSA: 0.6 ng/mL (ref <=4.0)

## 2017-03-09 LAB — LUTEINIZING HORMONE: LH: 1 m[IU]/mL — AB (ref 1.5–9.3)

## 2017-03-09 LAB — FOLLICLE STIMULATING HORMONE: FSH: 1.3 m[IU]/mL — ABNORMAL LOW (ref 1.6–8.0)

## 2017-03-09 LAB — HEMOGLOBIN A1C
HEMOGLOBIN A1C: 4.7 % (ref <5.7)
MEAN PLASMA GLUCOSE: 88 mg/dL

## 2017-03-10 ENCOUNTER — Encounter: Payer: Self-pay | Admitting: Physician Assistant

## 2017-03-10 DIAGNOSIS — R0602 Shortness of breath: Secondary | ICD-10-CM | POA: Insufficient documentation

## 2017-03-10 DIAGNOSIS — R0683 Snoring: Secondary | ICD-10-CM | POA: Insufficient documentation

## 2017-03-10 DIAGNOSIS — Z9189 Other specified personal risk factors, not elsewhere classified: Secondary | ICD-10-CM | POA: Insufficient documentation

## 2017-03-10 DIAGNOSIS — F325 Major depressive disorder, single episode, in full remission: Secondary | ICD-10-CM | POA: Insufficient documentation

## 2017-03-10 LAB — T4, FREE: FREE T4: 1.3 ng/dL (ref 0.8–1.8)

## 2017-03-10 LAB — TSH: TSH: 2.32 mIU/L (ref 0.40–4.50)

## 2017-03-10 LAB — T3, FREE: T3, Free: 3.6 pg/mL (ref 2.3–4.2)

## 2017-03-11 DIAGNOSIS — R7989 Other specified abnormal findings of blood chemistry: Secondary | ICD-10-CM | POA: Insufficient documentation

## 2017-03-11 LAB — PROLACTIN: PROLACTIN: 13.1 ng/mL (ref 2.0–18.0)

## 2017-03-11 NOTE — Progress Notes (Signed)
Hi Richard Cordova,  Your labs do show very low testosterone. Your FSH and LH are also low, which means that the problem is not in the gonads, but higher up in the pituitary gland or brain. I am going to refer you to an endocrinologist for treating this.  Your LDL cholesterol is still high. You don't currently require medication for this. Recommendation is weight loss and dietary modification: limit saturated fat/processed foods, increase intake of fruits and vegetables, choose whole grains  Other labs look good.  No evidence of diabetes No evidence of thyroid disease Normal metabolic panel Normal blood counts  Please let me know if you have any questions or concerns.  Best, Vinetta Bergamoharley

## 2017-03-11 NOTE — Addendum Note (Signed)
Addended by: Gena FrayUMMINGS, Brooklynn Brandenburg E on: 03/11/2017 12:01 PM   Modules accepted: Orders

## 2017-04-04 ENCOUNTER — Encounter: Payer: Self-pay | Admitting: Physician Assistant

## 2017-04-04 ENCOUNTER — Ambulatory Visit (INDEPENDENT_AMBULATORY_CARE_PROVIDER_SITE_OTHER): Payer: BLUE CROSS/BLUE SHIELD | Admitting: Physician Assistant

## 2017-04-04 VITALS — BP 133/81 | HR 67 | Ht 71.0 in | Wt 335.0 lb

## 2017-04-04 DIAGNOSIS — I10 Essential (primary) hypertension: Secondary | ICD-10-CM | POA: Diagnosis not present

## 2017-04-04 DIAGNOSIS — N521 Erectile dysfunction due to diseases classified elsewhere: Secondary | ICD-10-CM | POA: Diagnosis not present

## 2017-04-04 DIAGNOSIS — F332 Major depressive disorder, recurrent severe without psychotic features: Secondary | ICD-10-CM

## 2017-04-04 DIAGNOSIS — F900 Attention-deficit hyperactivity disorder, predominantly inattentive type: Secondary | ICD-10-CM

## 2017-04-04 MED ORDER — SILDENAFIL CITRATE 50 MG PO TABS: ORAL_TABLET | ORAL | 0 refills | Status: DC

## 2017-04-04 MED ORDER — AMPHETAMINE-DEXTROAMPHETAMINE 10 MG PO TABS: 10.0000 mg | ORAL_TABLET | Freq: Every day | ORAL | 0 refills | Status: DC

## 2017-04-04 MED ORDER — AMPHETAMINE-DEXTROAMPHET ER 10 MG PO CP24: 10.0000 mg | ORAL_CAPSULE | Freq: Every day | ORAL | 0 refills | Status: DC

## 2017-04-04 MED ORDER — ESCITALOPRAM OXALATE 20 MG PO TABS: ORAL_TABLET | ORAL | 1 refills | Status: DC

## 2017-04-04 NOTE — Progress Notes (Signed)
HPI:                                                                Richard Cordova is a 41 y.o. male who presents to [facility] today for depression / ADHD follow-up  Depression/Anxiety: Viibryd was not approved, so patient has not been taking any medication. Continues to endorse depressed mood, anhedonia, irritability. Denies symptoms of mania/hypomania. Denies suicidal thinking. Denies auditory/visual hallucinations. Previous meds:  Prescription written by his urologist years ago, unsure   HTN: currently managed with lifestyle. Denies vision change, headache, chest pain with exertion, orthopnea, lightheadedness, syncope and edema. Risk factors include: male sex, severe obesity, HLD  ADHD: taking adderall XR 69m without difficulty. Denies headache, palpitations or insomnia. Reports it wears off around 3pm. He would like to increase the dose.  Erectile dysfunction: reports weaker erections and difficulty maintaining erection to completion of intercourse. Requesting Viagra today.  Secondary hypogonadism: has an appointment with endocrinology next month. Continues to endorse low libido, lack of energy, decreased strength/endurance, erectile dysfunction and deterioration in work performance.   Past Medical History:  Diagnosis Date  . ADHD (attention deficit hyperactivity disorder), inattentive type 03/07/2014   Dr. SJohnnye Sima- Started on Vyvanse 214mJuly 2015, additional 1074mn the afternoon Aug 2015, Switched to Concerta Nov [id number] low libido, changed to Adderall XR 37m34mbruary 2016   . Fatigue 07/24/2013  . Hyperlipidemia 08/09/2013   Lipitor started June 2015   . Hypotestosteronism 07/26/2013   Dose lowered June 2015, Labs Due Jan 2016   . MRSA infection 07/24/2013  . Obesity 07/24/2013  . Secondary male hypogonadism 03/09/2017   Low FSH and LH 7[facility]018   Past Surgical History:  Procedure Laterality Date  . VASECTOMY  03/2012   Social  History  Substance Use Topics  . Smoking status: Never Smoker  . Smokeless tobacco: Never Used  . Alcohol use No   family history includes Alcoholism in his father and mother; Cancer in his father; Depression in his father and mother; Sleep apnea in his father; Testicular cancer in his father.  ROS: negative except as noted in the HPI  Medications: Current Outpatient Prescriptions  Medication Sig Dispense Refill  . cetirizine (ZYRTEC) 10 MG tablet Take 10 mg by mouth daily.    . cyclobenzaprine (FLEXERIL) 10 MG tablet Take 1 tablet (10 mg total) by mouth 3 (three) times daily as needed for muscle spasms. 30 tablet 1  . fluticasone (FLONASE) 50 MCG/ACT nasal spray Place 2 sprays into both nostrils daily. Daily for at least 2 weeks, then seasonally as needed 9.9 g 2  . ibuprofen (ADVIL,MOTRIN) 200 MG tablet Take 800 mg by mouth every 6 (six) hours as needed. For pain    . testosterone cypionate (DEPOTESTOTERONE CYPIONATE) 200 MG/ML injection (Keep in med history) 200mg44mQ 3 weeks given in clinic. 10 mL 0  . Vilazodone HCl (VIIBRYD STARTER PACK) 10 & 20 MG KIT Take 1 tablet by mouth every morning. Use as directed, 4 weeks QS 1 kit 0  . albuterol (PROVENTIL HFA;VENTOLIN HFA) 108 (90 BASE) MCG/ACT inhaler Inhale two puffs every 4-6 hours only as needed for shortness of breath or wheezing. 1 Inhaler 1  . amphetamine-dextroamphetamine (ADDERALL XR)  10 MG 24 hr capsule Take 1 capsule (10 mg total) by mouth daily. 30 capsule 0  . [START ON 05/04/2017] amphetamine-dextroamphetamine (ADDERALL XR) 10 MG 24 hr capsule Take 1 capsule (10 mg total) by mouth daily. 30 capsule 0  . [START ON 06/03/2017] amphetamine-dextroamphetamine (ADDERALL XR) 10 MG 24 hr capsule Take 1 capsule (10 mg total) by mouth daily. 30 capsule 0  . amphetamine-dextroamphetamine (ADDERALL) 10 MG tablet Take 1 tablet (10 mg total) by mouth daily with breakfast. 30 tablet 0  . [START ON 05/04/2017] amphetamine-dextroamphetamine  (ADDERALL) 10 MG tablet Take 1 tablet (10 mg total) by mouth daily with breakfast. 30 tablet 0  . [START ON 06/03/2017] amphetamine-dextroamphetamine (ADDERALL) 10 MG tablet Take 1 tablet (10 mg total) by mouth daily with breakfast. 30 tablet 0  . escitalopram (LEXAPRO) 20 MG tablet One half tab daily for a week then one tab by mouth daily 30 tablet 1  . sildenafil (VIAGRA) 50 MG tablet Take 1/2 to 1 tablet PO prn 30 minutes prior to sexual activity 10 tablet 0   No current facility-administered medications for this visit.    No Known Allergies     Objective:  BP 133/81 (BP Location: Right Arm, Cuff Size: Large)   Pulse 67   Ht 5' 11"  (1.803 m)   Wt (!) 335 lb (152 kg)   BMI 46.72 kg/m  Gen:  alert, not ill-appearing, no distress, appropriate for age, morbidly obese male HEENT: head normocephalic without obvious abnormality, conjunctiva and cornea clear, trachea midline CV: Normal rate, regular rhythm, s1 and s2 distinct, no murmurs, clicks or rubs  Pulm: Normal work of breathing, normal phonation Neuro: alert and oriented x 3, no tremor MSK: extremities atraumatic, normal gait and station Skin: intact, no rashes on exposed skin, no jaundice, no cyanosis Psych: well-groomed, cooperative, good eye contact, depressed mood, affect mood-congruent, speech is articulate, and thought processes clear and goal-directed   No results found for this or any previous visit (from the past 72 hour(s)). No results found.  Depression screen [facility] 2/9 04/04/2017 03/07/2017  Decreased Interest 3 3  Down, Depressed, Hopeless 3 3  PHQ - 2 Score 6 6  Altered sleeping 3 3  Tired, decreased energy 3 3  Change in appetite 3 3  Feeling bad or failure about yourself  3 3  Trouble concentrating 0 3  Moving slowly or fidgety/restless 0 0  Suicidal thoughts 0 0  PHQ-9 Score 18 21     Assessment and Plan: 41 y.o. male with   1. ADHD (attention deficit hyperactivity disorder), inattentive type - adding  Adderall 59m immediate release in the afternoon - continue Adderall 12mXR - checked NCSRS, no red flags - reviewed office policy on controlled substances with patient - follow-up in 3 months  2. Severe episode of recurrent major depressive disorder, without psychotic features (HCNational- PHQ9 score 18, moderately severe; no SI/HI - escitalopram (LEXAPRO) 20 MG tablet; One half tab daily for a week then one tab by mouth daily  Dispense: 30 tablet; Refill: 1  3. Erectile dysfunction due to diseases classified elsewhere - ED symptom score 15 - sildenafil (VIAGRA) 50 MG tablet; Take 1/2 to 1 tablet PO prn 30 minutes prior to sexual activity  Dispense: 10 tablet; Refill: 0  4. Hypertension goal BP (blood pressure) < 130/80 BP Readings from Last 3 Encounters:  04/04/17 133/81  03/07/17 130/87  05/11/16 113/69  - stage I hypertension - continue to monitor - therapeutic lifestyle  changes. Patient would like to go to bariatrics, but prefers to have mood controlled first. - patient does have cardiac risk factors. If BP remains elevated despite weight loss/TLC, will start antihypertensive therapy  Patient education and anticipatory guidance given Patient agrees with treatment plan Follow-up in 6 weeks or sooner as needed if symptoms worsen or fail to improve  Darlyne Russian PA-C

## 2017-04-04 NOTE — Patient Instructions (Signed)
Living With Depression Everyone experiences occasional disappointment, sadness, and loss in their lives. When you are feeling down, blue, or sad for at least 2 weeks in a row, it may mean that you have depression. Depression can affect your thoughts and feelings, relationships, daily activities, and physical health. It is caused by changes in the way your brain functions. If you receive a diagnosis of depression, your health care provider will tell you which type of depression you have and what treatment options are available to you. If you are living with depression, there are ways to help you recover from it and also ways to prevent it from coming back. How to cope with lifestyle changes Coping with stress Stress is your body's reaction to life changes and events, both good and bad. Stressful situations may include:  Getting married.  The death of a spouse.  Losing a job.  Retiring.  Having a baby.  Stress can last just a few hours or it can be ongoing. Stress can play a major role in depression, so it is important to learn both how to cope with stress and how to think about it differently. Talk with your health care provider or a counselor if you would like to learn more about stress reduction. He or she may suggest some stress reduction techniques, such as:  Music therapy. This can include creating music or listening to music. Choose music that you enjoy and that inspires you.  Mindfulness-based meditation. This kind of meditation can be done while sitting or walking. It involves being aware of your normal breaths, rather than trying to control your breathing.  Centering prayer. This is a kind of meditation that involves focusing on a spiritual word or phrase. Choose a word, phrase, or sacred image that is meaningful to you and that brings you peace.  Deep breathing. To do this, expand your stomach and inhale slowly through your nose. Hold your breath for 3-5 seconds, then exhale  slowly, allowing your stomach muscles to relax.  Muscle relaxation. This involves intentionally tensing muscles then relaxing them.  Choose a stress reduction technique that fits your lifestyle and personality. Stress reduction techniques take time and practice to develop. Set aside 5-15 minutes a day to do them. Therapists can offer training in these techniques. The training may be covered by some insurance plans. Other things you can do to manage stress include:  Keeping a stress diary. This can help you learn what triggers your stress and ways to control your response.  Understanding what your limits are and saying no to requests or events that lead to a schedule that is too full.  Thinking about how you respond to certain situations. You may not be able to control everything, but you can control how you react.  Adding humor to your life by watching funny films or TV shows.  Making time for activities that help you relax and not feeling guilty about spending your time this way.  Medicines Your health care provider may suggest certain medicines if he or she feels that they will help improve your condition. Avoid using alcohol and other substances that may prevent your medicines from working properly (may interact). It is also important to:  Talk with your pharmacist or health care provider about all the medicines that you take, their possible side effects, and what medicines are safe to take together.  Make it your goal to take part in all treatment decisions (shared decision-making). This includes giving input on the side   effects of medicines. It is best if shared decision-making with your health care provider is part of your total treatment plan.  If your health care provider prescribes a medicine, you may not notice the full benefits of it for 4-8 weeks. Most people who are treated for depression need to be on medicine for at least 6-12 months after they feel better. If you are taking  medicines as part of your treatment, do not stop taking medicines without first talking to your health care provider. You may need to have the medicine slowly decreased (tapered) over time to decrease the risk of harmful side effects. Relationships Your health care provider may suggest family therapy along with individual therapy and drug therapy. While there may not be family problems that are causing you to feel depressed, it is still important to make sure your family learns as much as they can about your mental health. Having your family's support can help make your treatment successful. How to recognize changes in your condition Everyone has a different response to treatment for depression. Recovery from major depression happens when you have not had signs of major depression for two months. This may mean that you will start to:  Have more interest in doing activities.  Feel less hopeless than you did 2 months ago.  Have more energy.  Overeat less often, or have better or improving appetite.  Have better concentration.  Your health care provider will work with you to decide the next steps in your recovery. It is also important to recognize when your condition is getting worse. Watch for these signs:  Having fatigue or low energy.  Eating too much or too little.  Sleeping too much or too little.  Feeling restless, agitated, or hopeless.  Having trouble concentrating or making decisions.  Having unexplained physical complaints.  Feeling irritable, angry, or aggressive.  Get help as soon as you or your family members notice these symptoms coming back. How to get support and help from others How to talk with friends and family members about your condition Talking to friends and family members about your condition can provide you with one way to get support and guidance. Reach out to trusted friends or family members, explain your symptoms to them, and let them know that you are  working with a health care provider to treat your depression. Financial resources Not all insurance plans cover mental health care, so it is important to check with your insurance carrier. If paying for co-pays or counseling services is a problem, search for a local or county mental health care center. They may be able to offer public mental health care services at low or no cost when you are not able to see a private health care provider. If you are taking medicine for depression, you may be able to get the generic form, which may be less expensive. Some makers of prescription medicines also offer help to patients who cannot afford the medicines they need. Follow these instructions at home:  Get the right amount and quality of sleep.  Cut down on using caffeine, tobacco, alcohol, and other potentially harmful substances.  Try to exercise, such as walking or lifting small weights.  Take over-the-counter and prescription medicines only as told by your health care provider.  Eat a healthy diet that includes plenty of vegetables, fruits, whole grains, low-fat dairy products, and lean protein. Do not eat a lot of foods that are high in solid fats, added sugars, or salt.    Keep all follow-up visits as told by your health care provider. This is important. Contact a health care provider if:  You stop taking your antidepressant medicines, and you have any of these symptoms: ? Nausea. ? Headache. ? Feeling lightheaded. ? Chills and body aches. ? Not being able to sleep (insomnia).  You or your friends and family think your depression is getting worse. Get help right away if:  You have thoughts of hurting yourself or others. If you ever feel like you may hurt yourself or others, or have thoughts about taking your own life, get help right away. You can go to your nearest emergency department or call:  Your local emergency services (911 in the [place]  A suicide crisis helpline, such as the  National Suicide Prevention Lifeline at 1-800-273-8255. This is open 24-hours a day.  Summary  If you are living with depression, there are ways to help you recover from it and also ways to prevent it from coming back.  Work with your health care team to create a management plan that includes counseling, stress management techniques, and healthy lifestyle habits. This information is not intended to replace advice given to you by your health care provider. Make sure you discuss any questions you have with your health care provider. Document Released: 07/05/2016 Document Revised: 07/05/2016 Document Reviewed: 07/05/2016 Elsevier Interactive Patient Education  2018 [employer]vier Inc.  

## 2017-05-11 DIAGNOSIS — Z6841 Body Mass Index (BMI) 40.0 and over, adult: Secondary | ICD-10-CM | POA: Diagnosis not present

## 2017-05-11 DIAGNOSIS — F909 Attention-deficit hyperactivity disorder, unspecified type: Secondary | ICD-10-CM | POA: Diagnosis not present

## 2017-05-11 DIAGNOSIS — E291 Testicular hypofunction: Secondary | ICD-10-CM | POA: Diagnosis not present

## 2017-05-16 ENCOUNTER — Ambulatory Visit: Payer: BLUE CROSS/BLUE SHIELD

## 2017-05-18 ENCOUNTER — Ambulatory Visit: Payer: BLUE CROSS/BLUE SHIELD | Admitting: Physician Assistant

## 2017-05-18 DIAGNOSIS — Z0189 Encounter for other specified special examinations: Secondary | ICD-10-CM

## 2017-05-31 ENCOUNTER — Other Ambulatory Visit: Payer: Self-pay | Admitting: Physician Assistant

## 2017-05-31 DIAGNOSIS — F332 Major depressive disorder, recurrent severe without psychotic features: Secondary | ICD-10-CM

## 2017-06-03 ENCOUNTER — Other Ambulatory Visit: Payer: Self-pay | Admitting: Physician Assistant

## 2017-06-03 DIAGNOSIS — N521 Erectile dysfunction due to diseases classified elsewhere: Secondary | ICD-10-CM

## 2017-08-03 ENCOUNTER — Other Ambulatory Visit: Payer: Self-pay | Admitting: Physician Assistant

## 2017-08-05 MED ORDER — AMPHETAMINE-DEXTROAMPHET ER 10 MG PO CP24
10.0000 mg | ORAL_CAPSULE | Freq: Every day | ORAL | 0 refills | Status: DC
Start: 2017-08-05 — End: 2017-09-21

## 2017-08-05 MED ORDER — AMPHETAMINE-DEXTROAMPHETAMINE 10 MG PO TABS: 10.0000 mg | ORAL_TABLET | Freq: Every day | ORAL | 0 refills | Status: DC

## 2017-08-05 NOTE — Telephone Encounter (Signed)
Rx request for Adderall and Adderall XR Last refilled 06/03/17 Last OV 04/04/2017 Next OV 08/23/2017 Please advise

## 2017-08-05 NOTE — Telephone Encounter (Signed)
LMOM for patient to collect Rx. [facility] and will have to [facility]od[facility] to collect his Rx.

## 2017-08-23 DIAGNOSIS — E291 Testicular hypofunction: Secondary | ICD-10-CM | POA: Diagnosis not present

## 2017-08-23 DIAGNOSIS — Z6841 Body Mass Index (BMI) 40.0 and over, adult: Secondary | ICD-10-CM | POA: Diagnosis not present

## 2017-09-13 ENCOUNTER — Other Ambulatory Visit: Payer: Self-pay | Admitting: Physician Assistant

## 2017-09-13 DIAGNOSIS — N521 Erectile dysfunction due to diseases classified elsewhere: Secondary | ICD-10-CM

## 2017-09-14 ENCOUNTER — Other Ambulatory Visit: Payer: Self-pay | Admitting: Physician Assistant

## 2017-09-14 DIAGNOSIS — N521 Erectile dysfunction due to diseases classified elsewhere: Secondary | ICD-10-CM

## 2017-09-21 ENCOUNTER — Ambulatory Visit: Payer: BLUE CROSS/BLUE SHIELD | Admitting: Physician Assistant

## 2017-09-21 ENCOUNTER — Encounter: Payer: Self-pay | Admitting: Physician Assistant

## 2017-09-21 VITALS — BP 129/85 | HR 87 | Wt 330.0 lb

## 2017-09-21 DIAGNOSIS — F325 Major depressive disorder, single episode, in full remission: Secondary | ICD-10-CM | POA: Diagnosis not present

## 2017-09-21 DIAGNOSIS — Z8042 Family history of malignant neoplasm of prostate: Secondary | ICD-10-CM

## 2017-09-21 DIAGNOSIS — J302 Other seasonal allergic rhinitis: Secondary | ICD-10-CM | POA: Insufficient documentation

## 2017-09-21 DIAGNOSIS — F900 Attention-deficit hyperactivity disorder, predominantly inattentive type: Secondary | ICD-10-CM | POA: Diagnosis not present

## 2017-09-21 DIAGNOSIS — J45990 Exercise induced bronchospasm: Secondary | ICD-10-CM | POA: Diagnosis not present

## 2017-09-21 DIAGNOSIS — I10 Essential (primary) hypertension: Secondary | ICD-10-CM | POA: Diagnosis not present

## 2017-09-21 DIAGNOSIS — Z5181 Encounter for therapeutic drug level monitoring: Secondary | ICD-10-CM | POA: Diagnosis not present

## 2017-09-21 DIAGNOSIS — B356 Tinea cruris: Secondary | ICD-10-CM | POA: Diagnosis not present

## 2017-09-21 DIAGNOSIS — N521 Erectile dysfunction due to diseases classified elsewhere: Secondary | ICD-10-CM

## 2017-09-21 HISTORY — DX: Family history of malignant neoplasm of prostate: Z80.42

## 2017-09-21 MED ORDER — ALBUTEROL SULFATE HFA 108 (90 BASE) MCG/ACT IN AERS: INHALATION_SPRAY | RESPIRATORY_TRACT | 1 refills | Status: DC

## 2017-09-21 MED ORDER — SILDENAFIL CITRATE 20 MG PO TABS: ORAL_TABLET | ORAL | 2 refills | Status: DC

## 2017-09-21 MED ORDER — CETIRIZINE HCL 10 MG PO TABS: 10.0000 mg | ORAL_TABLET | Freq: Every day | ORAL | 3 refills | Status: DC

## 2017-09-21 MED ORDER — AMPHETAMINE-DEXTROAMPHET ER 10 MG PO CP24: 10.0000 mg | ORAL_CAPSULE | Freq: Every day | ORAL | 0 refills | Status: DC

## 2017-09-21 MED ORDER — AMPHETAMINE-DEXTROAMPHETAMINE 10 MG PO TABS
10.0000 mg | ORAL_TABLET | Freq: Every day | ORAL | 0 refills | Status: DC
Start: 2017-09-21 — End: 2017-09-26

## 2017-09-21 MED ORDER — LISINOPRIL 10 MG PO TABS: 10.0000 mg | ORAL_TABLET | Freq: Every day | ORAL | 3 refills | Status: DC

## 2017-09-21 MED ORDER — FLUCONAZOLE 150 MG PO TABS: 150.0000 mg | ORAL_TABLET | ORAL | 0 refills | Status: DC

## 2017-09-21 NOTE — Patient Instructions (Addendum)
For your blood pressure: - Goal <130/80 - start Lisinopril daily - monitor and log blood pressures at home - check around the same time each day in a relaxed setting - Limit salt to <2000 mg/day - Follow DASH eating plan - limit alcohol to 2 standard drinks per day for men and 1 per day for women - avoid tobacco products - weight loss: 7% of current body weight - follow-up every 6 months for your blood pressure     DASH Eating Plan DASH stands for "Dietary Approaches to Stop Hypertension." The DASH eating plan is a healthy eating plan that has been shown to reduce high blood pressure (hypertension). It may also reduce your risk for type 2 diabetes, heart disease, and stroke. The DASH eating plan may also help with weight loss. What are tips for following this plan? General guidelines  Avoid eating more than 2,300 mg (milligrams) of salt (sodium) a day. If you have hypertension, you may need to reduce your sodium intake to 1,500 mg a day.  Limit alcohol intake to no more than 1 drink a day for nonpregnant women and 2 drinks a day for men. One drink equals 12 oz of beer, 5 oz of wine, or 1 oz of hard liquor.  Work with your health care provider to maintain a healthy body weight or to lose weight. Ask what an ideal weight is for you.  Get at least 30 minutes of exercise that causes your heart to beat faster (aerobic exercise) most days of the week. Activities may include walking, swimming, or biking.  Work with your health care provider or diet and nutrition specialist (dietitian) to adjust your eating plan to your individual calorie needs. Reading food labels  Check food labels for the amount of sodium per serving. Choose foods with less than 5 percent of the Daily Value of sodium. Generally, foods with less than 300 mg of sodium per serving fit into this eating plan.  To find whole grains, look for the word "whole" as the first word in the ingredient list. Shopping  Buy products  labeled as "low-sodium" or "no salt added."  Buy fresh foods. Avoid canned foods and premade or frozen meals. Cooking  Avoid adding salt when cooking. Use salt-free seasonings or herbs instead of table salt or sea salt. Check with your health care provider or pharmacist before using salt substitutes.  Do not fry foods. Cook foods using healthy methods such as baking, boiling, grilling, and broiling instead.  Cook with heart-healthy oils, such as olive, canola, soybean, or sunflower oil. Meal planning   Eat a balanced diet that includes: ? 5 or more servings of fruits and vegetables each day. At each meal, try to fill half of your plate with fruits and vegetables. ? Up to 6-8 servings of whole grains each day. ? Less than 6 oz of lean meat, poultry, or fish each day. A 3-oz serving of meat is about the same size as a deck of cards. One egg equals 1 oz. ? 2 servings of low-fat dairy each day. ? A serving of nuts, seeds, or beans 5 times each week. ? Heart-healthy fats. Healthy fats called Omega-3 fatty acids are found in foods such as flaxseeds and coldwater fish, like sardines, salmon, and mackerel.  Limit how much you eat of the following: ? Canned or prepackaged foods. ? Food that is high in trans fat, such as fried foods. ? Food that is high in saturated fat, such as fatty meat. ?  Sweets, desserts, sugary drinks, and other foods with added sugar. ? Full-fat dairy products.  Do not salt foods before eating.  Try to eat at least 2 vegetarian meals each week.  Eat more home-cooked food and less restaurant, buffet, and fast food.  When eating at a restaurant, ask that your food be prepared with less salt or no salt, if possible. What foods are recommended? The items listed may not be a complete list. Talk with your dietitian about what dietary choices are best for you. Grains Whole-grain or whole-wheat bread. Whole-grain or whole-wheat pasta. Brown rice. [place]. Bulgur.  Whole-grain and low-sodium cereals. Pita bread. Low-fat, low-sodium crackers. Whole-wheat flour tortillas. Vegetables Fresh or frozen vegetables (raw, steamed, roasted, or grilled). Low-sodium or reduced-sodium tomato and vegetable juice. Low-sodium or reduced-sodium tomato sauce and tomato paste. Low-sodium or reduced-sodium canned vegetables. Fruits All fresh, dried, or frozen fruit. Canned fruit in natural juice (without added sugar). Meat and other protein foods Skinless chicken or [place]. Ground chicken or [place]. Pork with fat trimmed off. Fish and seafood. Egg whites. Dried beans, peas, or lentils. Unsalted nuts, nut butters, and seeds. Unsalted canned beans. Lean cuts of beef with fat trimmed off. Low-sodium, lean deli meat. Dairy Low-fat (1%) or fat-free (skim) milk. Fat-free, low-fat, or reduced-fat cheeses. Nonfat, low-sodium ricotta or cottage cheese. Low-fat or nonfat yogurt. Low-fat, low-sodium cheese. Fats and oils Soft margarine without trans fats. Vegetable oil. Low-fat, reduced-fat, or light mayonnaise and salad dressings (reduced-sodium). Canola, safflower, olive, soybean, and sunflower oils. Avocado. Seasoning and other foods Herbs. Spices. Seasoning mixes without salt. Unsalted popcorn and pretzels. Fat-free sweets. What foods are not recommended? The items listed may not be a complete list. Talk with your dietitian about what dietary choices are best for you. Grains Baked goods made with fat, such as croissants, muffins, or some breads. Dry pasta or rice meal packs. Vegetables Creamed or fried vegetables. Vegetables in a cheese sauce. Regular canned vegetables (not low-sodium or reduced-sodium). Regular canned tomato sauce and paste (not low-sodium or reduced-sodium). Regular tomato and vegetable juice (not low-sodium or reduced-sodium). Angie Fava. Olives. Fruits Canned fruit in a light or heavy syrup. Fried fruit. Fruit in cream or butter sauce. Meat and other protein  foods Fatty cuts of meat. Ribs. Fried meat. Berniece Salines. Sausage. Bologna and other processed lunch meats. Salami. Fatback. Hotdogs. Bratwurst. Salted nuts and seeds. Canned beans with added salt. Canned or smoked fish. Whole eggs or egg yolks. Chicken or [place] with skin. Dairy Whole or 2% milk, cream, and half-and-half. Whole or full-fat cream cheese. Whole-fat or sweetened yogurt. Full-fat cheese. Nondairy creamers. Whipped toppings. Processed cheese and cheese spreads. Fats and oils Butter. Stick margarine. Lard. Shortening. Ghee. Bacon fat. Tropical oils, such as coconut, palm kernel, or palm oil. Seasoning and other foods Salted popcorn and pretzels. Onion salt, garlic salt, seasoned salt, table salt, and sea salt. Worcestershire sauce. Tartar sauce. Barbecue sauce. Teriyaki sauce. Soy sauce, including reduced-sodium. Steak sauce. Canned and packaged gravies. Fish sauce. Oyster sauce. Cocktail sauce. Horseradish that you find on the shelf. Ketchup. Mustard. Meat flavorings and tenderizers. Bouillon cubes. Hot sauce and Tabasco sauce. Premade or packaged marinades. Premade or packaged taco seasonings. Relishes. Regular salad dressings. Where to find more information:  National Heart, Lung, and [place]: https://wilson-eaton.com/  American Heart Association: www.heart.org Summary  The DASH eating plan is a healthy eating plan that has been shown to reduce high blood pressure (hypertension). It may also reduce your risk for type 2 diabetes,  heart disease, and stroke.  With the DASH eating plan, you should limit salt (sodium) intake to 2,300 mg a day. If you have hypertension, you may need to reduce your sodium intake to 1,500 mg a day.  When on the DASH eating plan, aim to eat more fresh fruits and vegetables, whole grains, lean proteins, low-fat dairy, and heart-healthy fats.  Work with your health care provider or diet and nutrition specialist (dietitian) to adjust your eating plan to your  individual calorie needs. This information is not intended to replace advice given to you by your health care provider. Make sure you discuss any questions you have with your health care provider. Document Released: 07/22/2011 Document Revised: 07/26/2016 Document Reviewed: 07/26/2016 Elsevier Interactive Patient Education  [employer].

## 2017-09-21 NOTE — Progress Notes (Signed)
HPI:                                                                Richard Cordova is a 42 y.o. male who presents to [facility]: Primary Care Sports Medicine today for medication management   ADHD: taking adderall XR 10mg  without difficulty. Denies headache, palpitations or insomnia. He was also given 10 mg IR in August to control afternoon symptoms. States this works well for him and he only needs the extra dose a few days per week.  Depression/Anxiety: Lexapro caused low libido and worsened ED. He self-discontinued. Reports he is doing well and has gotten through the stressful time. Denies depressed mood/anhedonia. Denies symptoms of mania/hypomania. Denies suicidal thinking. Denies auditory/visual hallucinations.  HTN: managing with diet and lifestyle. Does not check BP's at home. Denies vision change, headache, chest pain with exertion, orthopnea, lightheadedness, syncope and edema. Risk factors include: HLD, male sex, obesity, family history  Allergies/Wheeze: reports history of seasonal allergies. Takes Zyrtec. Also states he has a wheeze associated with exertion. He has been prescribed Albuterol in the past, which has helped. No formal diagnosis of asthma/reactive airway disease. Nonsmoker. No occupational exposures.  ED: takes 50 mg of Sildenafil as needed. He is followed by Urology for hypogonadism. Father recently diagnosed with prostate cancer. Reports he had a normal prostate exam and PSA. Denies LUTS.  Additionally, reports chronic "jock itch" for 2 years. Has tried multiple OTC topical treatments without relief.  Depression screen [facility] 2/9 09/21/2017 04/04/2017 03/07/2017  Decreased Interest 0 3 3  Down, Depressed, Hopeless 0 3 3  PHQ - 2 Score 0 6 6  Altered sleeping - 3 3  Tired, decreased energy - 3 3  Change in appetite - 3 3  Feeling bad or failure about yourself  - 3 3  Trouble concentrating - 0 3  Moving slowly or fidgety/restless - 0 0  Suicidal thoughts -  0 0  PHQ-9 Score - 18 21        Past Medical History:  Diagnosis Date  . ADHD (attention deficit hyperactivity disorder), inattentive type 03/07/2014   Dr. Elisabeth Most - Started on Vyvanse 20mg  July 2015, additional 10mg  in the afternoon Aug 2015, Switched to Concerta Nov 2015 caused low libido, changed to Adderall XR 20mg  February 2016   . Fatigue 07/24/2013  . Hyperlipidemia 08/09/2013   Lipitor started June 2015   . Hypotestosteronism 07/26/2013   Dose lowered June 2015, Labs Due Jan 2016   . MRSA infection 07/24/2013  . Obesity 07/24/2013  . Secondary male hypogonadism 03/09/2017   Low FSH and [facility] 02/2017   Past Surgical History:  Procedure Laterality Date  . VASECTOMY  03/2012   Social History   Tobacco Use  . Smoking status: Never Smoker  . Smokeless tobacco: Never Used  Substance Use Topics  . Alcohol use: No   family history includes Alcoholism in his father and mother; Cancer in his father; Depression in his father and mother; Heart attack in his paternal uncle; Prostate cancer in his father; Renal cancer in his father; Sleep apnea in his father; Testicular cancer in his father.    ROS: negative except as noted in the HPI  Medications: Current Outpatient Medications  Medication Sig Dispense Refill  .  amphetamine-dextroamphetamine (ADDERALL XR) 10 MG 24 hr capsule Take 1 capsule (10 mg total) by mouth daily. 30 capsule 0  . amphetamine-dextroamphetamine (ADDERALL) 10 MG tablet Take 1 tablet (10 mg total) by mouth daily with breakfast. 30 tablet 0  . cetirizine (ZYRTEC) 10 MG tablet Take 1 tablet (10 mg total) by mouth daily. 90 tablet 3  . testosterone cypionate (DEPOTESTOTERONE CYPIONATE) 200 MG/ML injection (Keep in med history) 200mg  IM Q 3 weeks given in clinic. 10 mL 0  . albuterol (PROVENTIL HFA;VENTOLIN HFA) 108 (90 Base) MCG/ACT inhaler Inhale 2 puffs, 30 minutes prior to exercise 1 Inhaler 1  . fluconazole (DIFLUCAN) 150 MG tablet Take 1 tablet (150 mg total) by  mouth once a week for 5 doses. 5 tablet 0  . lisinopril (PRINIVIL,ZESTRIL) 10 MG tablet Take 1 tablet (10 mg total) by mouth daily. 30 tablet 3  . sildenafil (REVATIO) 20 MG tablet Take 1-5 tabs PO 30 minutes prior to sexual activity 30 tablet 2   No current facility-administered medications for this visit.    Allergies  Allergen Reactions  . Escitalopram Other (See Comments)    Low libido, ED       Objective:  BP 129/85   Pulse 87   Wt (!) 330 lb (149.7 kg)   BMI 46.03 kg/m  Gen:  alert, not ill-appearing, no distress, appropriate for age, obese male HEENT: head normocephalic without obvious abnormality, conjunctiva and cornea clear, trachea midline Pulm: Normal work of breathing, normal phonation, clear to auscultation bilaterally, no wheezes, rales or rhonchi CV: Normal rate, regular rhythm, s1 and s2 distinct, no murmurs, clicks or rubs  Neuro: alert and oriented x 3, no tremor MSK: extremities atraumatic, normal gait and station, no peripheral edema Skin: intact, no rashes on exposed skin, no jaundice, no cyanosis Psych: well-groomed, cooperative, good eye contact, euthymic mood, affect mood-congruent, speech is articulate, and thought processes clear and goal-directed    No results found for this or any previous visit (from the past 72 hour(s)). No results found.    Assessment and Plan: 42 y.o. male with   1. Hypertension goal BP (blood pressure) < 130/80 BP Readings from Last 3 Encounters:  09/21/17 129/85  04/04/17 133/81  03/07/17 130/87  - given 4 CVD risk factors, starting low-dose Lisinopril - counseled on therapeutic lifestyle changes - follow-up in 3 months - Basic metabolic panel  2. ADHD (attention deficit hyperactivity disorder), inattentive type - checked NCCSRS, no red flags - amphetamine-dextroamphetamine (ADDERALL) 10 MG tablet; Take 1 tablet (10 mg total) by mouth daily with breakfast.  Dispense: 30 tablet; Refill: 0 -  amphetamine-dextroamphetamine (ADDERALL XR) 10 MG 24 hr capsule; Take 1 capsule (10 mg total) by mouth daily.  Dispense: 30 capsule; Refill: 0  3. Major depression in remission (HCC) - PHQ9 score =0 - patient felt mood was situational - active surveillance, repeat PHQ at follow-up  4. Seasonal allergies - cetirizine (ZYRTEC) 10 MG tablet; Take 1 tablet (10 mg total) by mouth daily.  Dispense: 90 tablet; Refill: 3  5. Tinea cruris - fluconazole (DIFLUCAN) 150 MG tablet; Take 1 tablet (150 mg total) by mouth once a week for 5 doses.  Dispense: 5 tablet; Refill: 0  6. Exercise induced bronchospasm - albuterol (PROVENTIL HFA;VENTOLIN HFA) 108 (90 Base) MCG/ACT inhaler; Inhale 2 puffs, 30 minutes prior to exercise  Dispense: 1 Inhaler; Refill: 1  7. Erectile dysfunction due to diseases classified elsewhere - sildenafil (REVATIO) 20 MG tablet; Take 1-5 tabs PO 30  minutes prior to sexual activity  Dispense: 30 tablet; Refill: 2  8. Family history of prostate cancer in father   89. Medication monitoring encounter - Basic metabolic panel   Patient education and anticipatory guidance given Patient agrees with treatment plan Follow-up in 3 months or sooner as needed if symptoms worsen or fail to improve  Levonne Hubertharley E. Seniyah Esker PA-C

## 2017-09-26 ENCOUNTER — Other Ambulatory Visit: Payer: Self-pay | Admitting: Physician Assistant

## 2017-09-26 DIAGNOSIS — F900 Attention-deficit hyperactivity disorder, predominantly inattentive type: Secondary | ICD-10-CM

## 2017-09-26 MED ORDER — AMPHETAMINE-DEXTROAMPHET ER 10 MG PO CP24: 10.0000 mg | ORAL_CAPSULE | Freq: Every day | ORAL | 0 refills | Status: DC

## 2017-09-26 MED ORDER — AMPHETAMINE-DEXTROAMPHETAMINE 10 MG PO TABS: 10.0000 mg | ORAL_TABLET | Freq: Every day | ORAL | 0 refills | Status: DC

## 2017-09-26 MED ORDER — AMPHETAMINE-DEXTROAMPHETAMINE 10 MG PO TABS: 10.0000 mg | ORAL_TABLET | ORAL | 0 refills | Status: DC

## 2017-09-26 MED ORDER — AMPHETAMINE-DEXTROAMPHET ER 10 MG PO CP24: 10.0000 mg | ORAL_CAPSULE | ORAL | 0 refills | Status: DC

## 2017-10-01 ENCOUNTER — Emergency Department (INDEPENDENT_AMBULATORY_CARE_PROVIDER_SITE_OTHER)
Admission: EM | Admit: 2017-10-01 | Discharge: 2017-10-01 | Disposition: A | Discharge status: Home / Self Care | Payer: BLUE CROSS/BLUE SHIELD | Source: Home / Self Care | Attending: Family Medicine | Admitting: Family Medicine

## 2017-10-01 ENCOUNTER — Encounter: Payer: Self-pay | Admitting: Emergency Medicine

## 2017-10-01 ENCOUNTER — Emergency Department (INDEPENDENT_AMBULATORY_CARE_PROVIDER_SITE_OTHER): Payer: BLUE CROSS/BLUE SHIELD

## 2017-10-01 DIAGNOSIS — M7989 Other specified soft tissue disorders: Secondary | ICD-10-CM | POA: Diagnosis not present

## 2017-10-01 DIAGNOSIS — L03032 Cellulitis of left toe: Secondary | ICD-10-CM | POA: Diagnosis not present

## 2017-10-01 DIAGNOSIS — M79675 Pain in left toe(s): Secondary | ICD-10-CM | POA: Diagnosis not present

## 2017-10-01 DIAGNOSIS — M79672 Pain in left foot: Secondary | ICD-10-CM | POA: Diagnosis not present

## 2017-10-01 MED ORDER — OXYCODONE-ACETAMINOPHEN 5-325 MG PO TABS: 1.0000 | ORAL_TABLET | Freq: Four times a day (QID) | ORAL | 0 refills | Status: DC | PRN

## 2017-10-01 MED ORDER — DOXYCYCLINE HYCLATE 100 MG PO CAPS: 100.0000 mg | ORAL_CAPSULE | Freq: Two times a day (BID) | ORAL | 0 refills | Status: DC

## 2017-10-01 NOTE — Discharge Instructions (Signed)
Elevate foot whenever possible.  May continue Ibuprofen 200mg , 4 tabs every 8 hours with food.

## 2017-10-01 NOTE — ED Triage Notes (Signed)
Pt c/o left pinky toe pain x5 days. Denies injury and states pain is constant and increasing.

## 2017-10-01 NOTE — ED Provider Notes (Signed)
[facility]    CSN: [id number] Arrival date & time: 10/01/17  1040     History   Chief Complaint Chief Complaint  Patient presents with  . Toe Pain    HPI Richard Cordova is a 42 y.o. male.   Patient complains of pain and swelling in his left 5th toe for five days.  He recalls no injury.  No history of gout.   The history is provided by the patient.  Toe Pain  This is a new problem. Episode onset: 5 days. The problem occurs constantly. The problem has been gradually worsening. The symptoms are aggravated by walking. Nothing relieves the symptoms. Treatments tried: ibuprofen. The treatment provided no relief.    Past Medical History:  Diagnosis Date  . ADHD (attention deficit hyperactivity disorder), inattentive type 03/07/2014   Dr. Elisabeth Most - Started on Vyvanse 20mg  July 2015, additional 10mg  in the afternoon Aug 2015, Switched to Concerta Nov 2015 caused low libido, changed to Adderall XR 20mg  February 2016   . Fatigue 07/24/2013  . Hyperlipidemia 08/09/2013   Lipitor started June 2015   . Hypotestosteronism 07/26/2013   Dose lowered June 2015, Labs Due Jan 2016   . MRSA infection 07/24/2013  . Obesity 07/24/2013  . Secondary male hypogonadism 03/09/2017   Low FSH and [facility] 02/2017    Patient Active Problem List   Diagnosis Date Noted  . Family history of prostate cancer in father 09/21/2017  . Exercise induced bronchospasm 09/21/2017  . Tinea cruris 09/21/2017  . Seasonal allergies 09/21/2017  . Erectile dysfunction due to diseases classified elsewhere 04/04/2017  . Hypertension goal BP (blood pressure) < 130/80 04/04/2017  . Low serum follicle stimulating hormone (FSH) 03/11/2017  . Low serum luteinizing hormone (LH) 03/11/2017  . Shortness of breath 03/10/2017  . At risk for obstructive sleep apnea 03/10/2017  . Major depression in remission (HCC) 03/10/2017  . Snoring 03/10/2017  . Secondary male hypogonadism 03/09/2017  . ADHD (attention deficit  hyperactivity disorder), inattentive type 03/07/2014  . Question of Remote HSV infection 08/29/2013  . Hyperlipidemia 08/09/2013  . Class 3 severe obesity due to excess calories with serious comorbidity and body mass index (BMI) of 45.0 to 49.9 in adult (HCC) 07/24/2013  . Fatigue 07/24/2013    Past Surgical History:  Procedure Laterality Date  . VASECTOMY  03/2012       Home Medications    Prior to Admission medications   Medication Sig Start Date End Date Taking? Authorizing Provider  albuterol (PROVENTIL HFA;VENTOLIN HFA) 108 (90 Base) MCG/ACT inhaler Inhale 2 puffs, 30 minutes prior to exercise 09/21/17 09/21/18  Carlis Stable, PA-C  amphetamine-dextroamphetamine (ADDERALL XR) 10 MG 24 hr capsule Take 1 capsule (10 mg total) by mouth daily. 10/20/17   Carlis Stable, PA-C  amphetamine-dextroamphetamine (ADDERALL XR) 10 MG 24 hr capsule Take 1 capsule (10 mg total) by mouth every morning. 09/26/17   Carlis Stable, PA-C  amphetamine-dextroamphetamine (ADDERALL) 10 MG tablet Take 1 tablet (10 mg total) by mouth daily with breakfast. 10/20/17   Carlis Stable, PA-C  amphetamine-dextroamphetamine (ADDERALL) 10 MG tablet Take 1 tablet (10 mg total) by mouth every morning. 11/19/17   Carlis Stable, PA-C  cetirizine (ZYRTEC) 10 MG tablet Take 1 tablet (10 mg total) by mouth daily. 09/21/17   Carlis Stable, PA-C  doxycycline (VIBRAMYCIN) 100 MG capsule Take 1 capsule (100 mg total) by mouth 2 (two) times daily. Take with food. 10/01/17   Lattie Haw,  MD  fluconazole (DIFLUCAN) 150 MG tablet Take 1 tablet (150 mg total) by mouth once a week for 5 doses. 09/21/17 10/20/17  Carlis Stable, PA-C  lisinopril (PRINIVIL,ZESTRIL) 10 MG tablet Take 1 tablet (10 mg total) by mouth daily. 09/21/17   Carlis Stable, PA-C  oxyCODONE-acetaminophen (PERCOCET) 5-325 MG tablet Take 1 tablet by mouth every 6 (six) hours  as needed for moderate pain. 10/01/17   Lattie Haw, MD  sildenafil (REVATIO) 20 MG tablet Take 1-5 tabs PO 30 minutes prior to sexual activity 09/21/17   Carlis Stable, PA-C  testosterone cypionate (DEPOTESTOTERONE CYPIONATE) 200 MG/ML injection (Keep in med history) 200mg  IM Q 3 weeks given in clinic. 01/29/14   Laren Boom, DO    Family History Family History  Problem Relation Age of Onset  . Cancer Father        testicular  . Alcoholism Father   . Testicular cancer Father   . Sleep apnea Father   . Depression Father   . Prostate cancer Father   . Renal cancer Father   . Alcoholism Mother   . Depression Mother   . Heart attack Paternal Uncle     Social History Social History   Tobacco Use  . Smoking status: Never Smoker  . Smokeless tobacco: Never Used  Substance Use Topics  . Alcohol use: No  . Drug use: No     Allergies   Escitalopram   Review of Systems Review of Systems  All other systems reviewed and are negative.    Physical Exam Triage Vital Signs ED Triage Vitals  Enc Vitals Group     BP 10/01/17 1129 126/84     Pulse Rate 10/01/17 1129 70     Resp --      Temp 10/01/17 1129 97.9 F (36.6 C)     Temp Source 10/01/17 1129 Oral     SpO2 10/01/17 1129 97 %     Weight 10/01/17 1130 (!) 330 lb (149.7 kg)     Height --      Head Circumference --      Peak Flow --      Pain Score 10/01/17 1129 7     Pain Loc --      Pain Edu? --      Excl. in GC? --    No data found.  Updated Vital Signs BP 126/84 (BP Location: Right Arm)   Pulse 70   Temp 97.9 F (36.6 C) (Oral)   Wt (!) 330 lb (149.7 kg)   SpO2 97%   BMI 46.03 kg/m   Visual Acuity Right Eye Distance:   Left Eye Distance:   Bilateral Distance:    Right Eye Near:   Left Eye Near:    Bilateral Near:     Physical Exam  Constitutional: He appears well-developed and well-nourished. No distress.  HENT:  Head: Normocephalic.  Eyes: Pupils are equal, round, and  reactive to light.  Cardiovascular: Normal rate.  Pulmonary/Chest: Effort normal.  Musculoskeletal: He exhibits no edema.       Feet:  Left 5th toe has distinct tenderness to palpation and erythema on its medial aspect.  There is mild tenderness to palpation over the left 5th MTP joint.  Neurological: He is alert.  Skin: Skin is warm and dry.  Nursing note and vitals reviewed.    UC Treatments / Results  Labs (all labs ordered are listed, but only abnormal results are displayed) Labs Reviewed  URIC ACID  EKG  EKG Interpretation None       Radiology Dg Foot Complete Left  Result Date: 10/01/2017 CLINICAL DATA:  Pain and swelling and redness at the left fifth toe. EXAM: LEFT FOOT - COMPLETE 3+ VIEW COMPARISON:  None. FINDINGS: Negative for fracture or dislocation. There appears to be soft tissue swelling along the lateral aspect of the foot. Alignment of foot is normal. No evidence for a radiopaque foreign body. IMPRESSION: No acute bone abnormality to left foot. Electronically Signed   By: Richarda OverlieAdam  Henn M.D.   On: 10/01/2017 13:16    Procedures Procedures (including critical care time)  Medications Ordered in UC Medications - No data to display   Initial Impression / Assessment and Plan / UC Course  I have reviewed the triage vital signs and the nursing notes.  Pertinent labs & imaging results that were available during my care of the patient were reviewed by me and considered in my medical decision making (see chart for details).    Begin empiric doxycycline 100mg  BID. May also represent initial episode of gout.  Will check Uric Acid. Elevate foot whenever possible.  May continue Ibuprofen 200mg , 4 tabs every 8 hours with food.  Followup with Family Doctor if not improved in about 5 days.     Final Clinical Impressions(s) / UC Diagnoses   Final diagnoses:  Cellulitis of small toe of left foot    ED Discharge Orders        Ordered    doxycycline (VIBRAMYCIN)  100 MG capsule  2 times daily     10/01/17 1402    oxyCODONE-acetaminophen (PERCOCET) 5-325 MG tablet  Every 6 hours PRN     10/01/17 1402          Lattie HawBeese, Stephen A, MD 10/07/17 2119

## 2017-10-02 LAB — URIC ACID: URIC ACID, SERUM: 5.7 mg/dL (ref 4.0–8.0)

## 2017-10-03 ENCOUNTER — Telehealth: Payer: Self-pay | Admitting: Emergency Medicine

## 2017-10-04 ENCOUNTER — Ambulatory Visit (HOSPITAL_COMMUNITY)
Admission: RE | Admit: 2017-10-04 | Discharge: 2017-10-04 | Disposition: A | Discharge status: Home / Self Care | Payer: BLUE CROSS/BLUE SHIELD | Source: Ambulatory Visit | Attending: Sports Medicine | Admitting: Sports Medicine

## 2017-10-04 ENCOUNTER — Ambulatory Visit: Payer: BLUE CROSS/BLUE SHIELD | Admitting: Sports Medicine

## 2017-10-04 ENCOUNTER — Encounter: Payer: Self-pay | Admitting: Sports Medicine

## 2017-10-04 DIAGNOSIS — R937 Abnormal findings on diagnostic imaging of other parts of musculoskeletal system: Secondary | ICD-10-CM | POA: Insufficient documentation

## 2017-10-04 DIAGNOSIS — M869 Osteomyelitis, unspecified: Secondary | ICD-10-CM | POA: Diagnosis not present

## 2017-10-04 DIAGNOSIS — M7989 Other specified soft tissue disorders: Secondary | ICD-10-CM | POA: Diagnosis not present

## 2017-10-04 DIAGNOSIS — S91302A Unspecified open wound, left foot, initial encounter: Secondary | ICD-10-CM | POA: Diagnosis not present

## 2017-10-04 HISTORY — DX: Osteomyelitis, unspecified: M86.9

## 2017-10-04 MED ORDER — GADOBENATE DIMEGLUMINE 529 MG/ML IV SOLN
20.0000 mL | Freq: Once | INTRAVENOUS | Status: AC | PRN
  Administered 2017-10-04: 20 mL via INTRAVENOUS

## 2017-10-04 MED ORDER — DOXYCYCLINE HYCLATE 100 MG PO TABS: 100.0000 mg | ORAL_TABLET | Freq: Two times a day (BID) | ORAL | 0 refills | Status: DC

## 2017-10-04 MED ORDER — CIPROFLOXACIN HCL 750 MG PO TABS: 750.0000 mg | ORAL_TABLET | Freq: Two times a day (BID) | ORAL | 0 refills | Status: AC

## 2017-10-04 NOTE — Progress Notes (Addendum)
Subjective:    I'm seeing this patient as a consultation for:  Dr. Donna Christen  CC: Left toe ulcer  HPI: This is a pleasant 42 year old male, for the past week he has had a painful sore with swelling on his left fifth toe.  He was seen in urgent care, the lesion was able to cellulitis, uric acid levels were normal, he was treated appropriately with doxycycline, unfortunately it has worsened over the past couple of days, it turned into an open, draining ulcer with worsening of the swelling and pain in the left fifth toe at the distal interphalangeal joint.  Moderate, worsening.  No constitutional symptoms.  Patient is not a diabetic, no vascular disease.  I reviewed the past medical history, family history, social history, surgical history, and allergies today and no changes were needed.  Please see the problem list section below in epic for further details.  Past Medical History: Past Medical History:  Diagnosis Date  . ADHD (attention deficit hyperactivity disorder), inattentive type 03/07/2014   Dr. Elisabeth Most - Started on Vyvanse 20mg  July 2015, additional 10mg  in the afternoon Aug 2015, Switched to Concerta Nov 2015 caused low libido, changed to Adderall XR 20mg  February 2016   . Fatigue 07/24/2013  . Hyperlipidemia 08/09/2013   Lipitor started June 2015   . Hypotestosteronism 07/26/2013   Dose lowered June 2015, Labs Due Jan 2016   . MRSA infection 07/24/2013  . Obesity 07/24/2013  . Secondary male hypogonadism 03/09/2017   Low FSH and [facility] 02/2017   Past Surgical History: Past Surgical History:  Procedure Laterality Date  . VASECTOMY  03/2012   Social History: Social History   Socioeconomic History  . Marital status: Married    Spouse name: None  . Number of children: None  . Years of education: None  . Highest education level: None  Social Needs  . Financial resource strain: None  . Food insecurity - worry: None  . Food insecurity - inability: None  . Transportation  needs - medical: None  . Transportation needs - non-medical: None  Occupational History  . None  Tobacco Use  . Smoking status: Never Smoker  . Smokeless tobacco: Never Used  Substance and Sexual Activity  . Alcohol use: No  . Drug use: No  . Sexual activity: Yes  Other Topics Concern  . None  Social History Narrative  . None   Family History: Family History  Problem Relation Age of Onset  . Cancer Father        testicular  . Alcoholism Father   . Testicular cancer Father   . Sleep apnea Father   . Depression Father   . Prostate cancer Father   . Renal cancer Father   . Alcoholism Mother   . Depression Mother   . Heart attack Paternal Uncle    Allergies: Allergies  Allergen Reactions  . Escitalopram Other (See Comments)    Low libido, ED   Medications: See med rec.  Review of Systems: No headache, visual changes, nausea, vomiting, diarrhea, constipation, dizziness, abdominal pain, skin rash, fevers, chills, night sweats, weight loss, swollen lymph nodes, body aches, joint swelling, muscle aches, chest pain, shortness of breath, mood changes, visual or auditory hallucinations.   Objective:   General: Well Developed, well nourished, and in no acute distress.  Neuro:  Extra-ocular muscles intact, able to move all 4 extremities, sensation grossly intact.  Deep tendon reflexes tested were normal. Psych: Alert and oriented, mood congruent with affect. ENT:  Ears and nose appear unremarkable.  Hearing grossly normal. Neck: Unremarkable overall appearance, trachea midline.  No visible thyroid enlargement. Eyes: Conjunctivae and lids appear unremarkable.  Pupils equal and round. Skin: Warm and dry, no rashes noted.  Cardiovascular: Pulses palpable, no extremity edema. Left foot: Erythema of the left fifth toe with an open, 1 cm draining ulcer, macerated with a bit of granulation tissue over the medial aspect of the toe at the distal interphalangeal joint, positive probe to  bone sign. Range of motion is full in all directions. Strength is 5/5 in all directions. No hallux valgus. No pes cavus or pes planus. No abnormal callus noted. No pain over the navicular prominence, or base of fifth metatarsal. No tenderness to palpation of the calcaneal insertion of plantar fascia. No pain at the Achilles insertion. No pain over the calcaneal bursa. No pain of the retrocalcaneal bursa. No tenderness to palpation over the tarsals, metatarsals, or phalanges. No hallux rigidus or limitus. No tenderness palpation over interphalangeal joints. No pain with compression of the metatarsal heads. Neurovascularly intact distally. This was debrided aggressively with peroxide, dressed.  Postop shoe placed.  Impression and Recommendations:   This case required medical decision making of moderate complexity.  Suspect osteomyelitis of fifth toe of left foot (HCC) Question left fifth toe osteomyelitis. Positive probe sign with Q-tip probing to the bone. Continue doxycycline prescribed in urgent care, x-rays unremarkable, adding MRI with and without contrast. I am also adding ciprofloxacin for Pseudomonas coverage. Return to go over imaging results. If osteomyelitis is diagnosed I will comanage with infectious disease.  MRI does confirm osteomyelitis, urgent referral to infectious disease. ___________________________________________ Ihor Austinhomas J. Benjamin Stainhekkekandam, M.D., ABFM., CAQSM. Primary Care and Sports Medicine [facility]  Adjunct Instructor of Family Medicine  [employer]

## 2017-10-04 NOTE — Assessment & Plan Note (Addendum)
Question left fifth toe osteomyelitis. Positive probe sign with Q-tip probing to the bone. Continue doxycycline prescribed in urgent care, x-rays unremarkable, adding MRI with and without contrast. I am also adding ciprofloxacin for Pseudomonas coverage. Return to go over imaging results. If osteomyelitis is diagnosed I will comanage with infectious disease.  MRI does confirm osteomyelitis, urgent referral to infectious disease.

## 2017-10-04 NOTE — Patient Instructions (Signed)
Clean with peroxide and then irrigate with saline daily, keep covered with Neosporin and a Band-Aid. Follow-up with me after MRI for further evaluation.

## 2017-10-05 ENCOUNTER — Ambulatory Visit: Payer: BLUE CROSS/BLUE SHIELD | Admitting: Infectious Diseases

## 2017-10-05 ENCOUNTER — Encounter: Payer: Self-pay | Admitting: Infectious Diseases

## 2017-10-05 DIAGNOSIS — Z6841 Body Mass Index (BMI) 40.0 and over, adult: Secondary | ICD-10-CM | POA: Diagnosis not present

## 2017-10-05 DIAGNOSIS — M869 Osteomyelitis, unspecified: Secondary | ICD-10-CM | POA: Diagnosis not present

## 2017-10-05 LAB — POCT I-STAT CREATININE: Creatinine, Ser: 0.8 mg/dL (ref 0.61–1.24)

## 2017-10-05 MED ORDER — DALBAVANCIN HCL 500 MG IV SOLR: 1500.0000 mg | Freq: Once | INTRAVENOUS | 0 refills | Status: AC

## 2017-10-05 MED ORDER — DOXYCYCLINE HYCLATE 100 MG PO TABS: 100.0000 mg | ORAL_TABLET | Freq: Two times a day (BID) | ORAL | 0 refills | Status: AC

## 2017-10-05 NOTE — Assessment & Plan Note (Addendum)
Given he has a toe infection, there is not a chance to biopsy this.  We discussed his options- 1) continue doxy alone 2) start PIC line, IV vanco/ceftriaxone for 6 weeks.  3) continue doxy, one time dose of dalavancin.   After discussing this, will give him the single dose of dalvancin and continue doxy.  [facility] He will rtc in 2 weeks.  I asked him to call back if he has any issues in the intervening period- f/c, worsening pain, swelling, ascending erythema. He agrees.

## 2017-10-05 NOTE — Progress Notes (Signed)
   Subjective:    Patient ID: Richard HammockJohn Cordova, male    DOB: 03/27/1976, 42 y.o.   MRN: [id number]  HPI 42 yo M with hyperlipidemia, obesity (BMI 46.3). He had a previous MRSA infection 2014 on his scrotum. He had this lanced and was given oral anbx.  He was seen in Urgent Care on 2-16 with acute onset of L 5th toe pain, erythema and swelling. He had plain film (-).  He was given doxy for 7 days. He did not improve.  His Uric acid was normal.  He was seen by his PCP 2-19 and was given 7 more days of doxy. He had MRI on 2-19 showing: Marrow signal abnormalities of the left fifth toe involving the distal and middle phalanges as well as head of the proximal phalanx. This in conjunction with surrounding soft tissue edema are consistent with changes of acute osteomyelitis with surrounding cellulitis. No enhancing fluid collections to suggest a soft tissue Abscess. He has had no proximal erythema or streaking.   The past medical history, family history and social history were reviewed/updated in EPIC   Review of Systems  Constitutional: Positive for chills and fever. Negative for appetite change and unexpected weight change.  Respiratory: Negative for cough and shortness of breath.   Gastrointestinal: Negative for diarrhea and nausea.  Genitourinary: Negative for difficulty urinating.  Psychiatric/Behavioral: Negative for sleep disturbance.  wt has been down and now back up.  His temp has been < 101.  More irratable.  Defers flu shot.  Please see HPI. All other systems reviewed and negative.     Objective:   Physical Exam  Constitutional: He appears well-developed and well-nourished.  HENT:  Mouth/Throat: No oropharyngeal exudate.  Eyes: EOM are normal. Pupils are equal, round, and reactive to light.  Neck: Neck supple.  Cardiovascular: Normal rate, regular rhythm and normal heart sounds.  Pulmonary/Chest: Effort normal and breath sounds normal.  Abdominal: Soft. Bowel sounds are normal.  There is no tenderness. There is no rebound.  Musculoskeletal: He exhibits no edema.       Feet:  Lymphadenopathy:    He has no cervical adenopathy.         Assessment & Plan:

## 2017-10-05 NOTE — Addendum Note (Signed)
Addended by: Monica BectonHEKKEKANDAM, Merla Sawka J on: 10/05/2017 09:49 AM   Modules accepted: Orders

## 2017-10-05 NOTE — Assessment & Plan Note (Signed)
He is working on resuming his diet.

## 2017-10-07 ENCOUNTER — Ambulatory Visit (HOSPITAL_COMMUNITY)
Admission: RE | Admit: 2017-10-07 | Discharge: 2017-10-07 | Disposition: A | Discharge status: Home / Self Care | Payer: BLUE CROSS/BLUE SHIELD | Source: Ambulatory Visit | Attending: Infectious Diseases | Admitting: Infectious Diseases

## 2017-10-07 DIAGNOSIS — M869 Osteomyelitis, unspecified: Secondary | ICD-10-CM | POA: Diagnosis not present

## 2017-10-07 MED ORDER — NON FORMULARY: 500.0000 mg | Freq: Once | Status: DC

## 2017-10-07 MED ORDER — DEXTROSE 5 % IV SOLN
1500.0000 mg | Freq: Once | INTRAVENOUS | Status: AC
  Administered 2017-10-07: 1500 mg via INTRAVENOUS
  Filled 2017-10-07: qty 75

## 2017-10-07 NOTE — Progress Notes (Signed)
Pt arrived for IV infusion of Dalbavancin per order; Pt tolerated well with no complications noted; pt alert, oriented and ambulatory upon discharge;  Ordering Provider: Johny SaxHatcher, Jeffrey, MD Associated Diagnosis: Osteomyelitis

## 2017-10-07 NOTE — Discharge Instructions (Signed)
Dalbavancin injection °What is this medicine? °DALBAVANCIN (DAL ba van sin) is an antibiotic. It is used to treat certain kinds of bacterial infections. It will not work for colds, flu, or other viral infections. °This medicine may be used for other purposes; ask your health care provider or pharmacist if you have questions. °COMMON BRAND NAME(S): DALVANCE °What should I tell my health care provider before I take this medicine? °They need to know if you have any of these conditions: °-intestine problems, like colitis °-an unusual or allergic reaction to dalbavancin, other medicines, foods, dyes, or preservatives °-pregnant or trying to get pregnant °-breast-feeding °How should I use this medicine? °This medicine is infused into a vein. It is usually given by a health care professional in a hospital or clinic setting. °If you get this medicine at home, you will be taught how to prepare and give this medicine. Use exactly as directed. Take your medicine at regular intervals. Do not take your medicine more often than directed. °It is important that you put your used needles and syringes in a special sharps container. Do not put them in a trash can. If you do not have a sharps container, call your pharmacist or healthcare provider to get one. °Talk to your pediatrician regarding the use of this medicine in children. Special care may be needed. °Overdosage: If you think you have taken too much of this medicine contact a poison control center or emergency room at once. °NOTE: This medicine is only for you. Do not share this medicine with others. °What if I miss a dose? °It is important not to miss your dose. Call your doctor or health care professional if you are unable to keep an appointment. If you give yourself the medicine and you miss a dose, take it as soon as you can. If it is almost time for your next dose, take only that dose. Do not take double or extra doses. °What may interact with this medicine? °This  medicine may interact with the following medications: °-birth control pills °This list may not describe all possible interactions. Give your health care provider a list of all the medicines, herbs, non-prescription drugs, or dietary supplements you use. Also tell them if you smoke, drink alcohol, or use illegal drugs. Some items may interact with your medicine. °What should I watch for while using this medicine? °Tell your doctor or healthcare professional if your symptoms do not start to get better or if they get worse. °Do not treat diarrhea with over the counter products. Contact your doctor if you have diarrhea that lasts more than 2 days or if it is severe and watery. °What side effects may I notice from receiving this medicine? °Side effects that you should report to your doctor or health care professional as soon as possible: °-allergic reactions like skin rash, itching or hives, swelling of the face, lips, or tongue °-bloody or watery diarrhea °Side effects that usually do not require medical attention (report to your doctor or health care professional if they continue or are bothersome): °-diarrhea °-headache °-nausea, vomiting °This list may not describe all possible side effects. Call your doctor for medical advice about side effects. You may report side effects to FDA at 1-800-FDA-1088. °Where should I keep my medicine? °Keep out of the reach of children. °This drug is usually given in a hospital or clinic and will not be stored at home. In rare cases, this medicine may be given at home. °If you are using this medicine   at home, you will be instructed on how to store this medicine. Throw away any unused medicine after the expiration date on the label. °NOTE: This sheet is a summary. It may not cover all possible information. If you have questions about this medicine, talk to your doctor, pharmacist, or health care provider. °© 2018 [employer]vier/Gold Standard (2015-09-04 08:38:08) ° °

## 2017-10-19 ENCOUNTER — Encounter: Payer: Self-pay | Admitting: Infectious Diseases

## 2017-10-19 ENCOUNTER — Ambulatory Visit: Payer: BLUE CROSS/BLUE SHIELD | Admitting: Infectious Diseases

## 2017-10-19 VITALS — BP 113/74 | HR 77 | Temp 97.8°F | Ht 72.0 in | Wt 328.0 lb

## 2017-10-19 DIAGNOSIS — G5601 Carpal tunnel syndrome, right upper limb: Secondary | ICD-10-CM

## 2017-10-19 DIAGNOSIS — M869 Osteomyelitis, unspecified: Secondary | ICD-10-CM | POA: Diagnosis not present

## 2017-10-19 DIAGNOSIS — G5600 Carpal tunnel syndrome, unspecified upper limb: Secondary | ICD-10-CM | POA: Insufficient documentation

## 2017-10-19 NOTE — Assessment & Plan Note (Signed)
Offered to send him to hand surgery.  He will see his PCP later this week. Suggested at that time at PCP discretion he may get MRI, hand surgery eval ect.Richard Cordova..Richard Cordova

## 2017-10-19 NOTE — Progress Notes (Signed)
   Subjective:    Patient ID: Richard HammockJohn Scerbo, male    DOB: 1975-10-16, 42 y.o.   MRN: [id number]  HPI 42 yo M with hyperlipidemia, obesity (BMI 46.3). He had a previous MRSA infection 2014 on his scrotum. He had this lanced and was given oral anbx.  He was seen in Urgent Care on 2-16 with acute onset of L 5th toe pain, erythema and swelling. He had plain film (-).  He was given doxy for 7 days. He did not improve.  His Uric acid was normal.  He was seen by his PCP 2-19 and was given 7 more days of doxy. He had MRI on 2-19 showing: Marrow signal abnormalities of the left fifth toe involving the distal and middle phalanges as well as head of the proximal phalanx. This in conjunction with surrounding soft tissue edema are consistent with changes of acute osteomyelitis with surrounding cellulitis. No enhancing fluid collections to suggest a soft tissue Abscess. He was seen in ID on 2-20 and was given a single dose of dalvancin (2-22) and continued on doxy.  Night after getting infusion, he had chills.  His foot is much better, healed.  Since being on anbx he has developed "carpal tunnel" in his R hand- pain in ligaments, numbness expending up his arm. No neck pain. No recent injury.   Review of Systems  Constitutional: Negative for chills and fever.  Gastrointestinal: Negative for constipation and diarrhea.  Genitourinary: Negative for difficulty urinating.  Neurological: Positive for numbness. Negative for headaches.       Objective:   Physical Exam  Constitutional: He appears well-developed and well-nourished.  Musculoskeletal:       Arms:      Feet:           Assessment & Plan:

## 2017-10-19 NOTE — Assessment & Plan Note (Signed)
He will continue on doxy for 24 more days.  He is doing well.  I asked that he call if he has any worsening of his foot. Also if he has any issues with the anbx.  rtc prn.

## 2017-11-04 ENCOUNTER — Other Ambulatory Visit: Payer: Self-pay | Admitting: Physician Assistant

## 2017-11-04 DIAGNOSIS — F900 Attention-deficit hyperactivity disorder, predominantly inattentive type: Secondary | ICD-10-CM

## 2017-11-10 MED ORDER — AMPHETAMINE-DEXTROAMPHET ER 10 MG PO CP24: 10.0000 mg | ORAL_CAPSULE | Freq: Every day | ORAL | 0 refills | Status: DC

## 2017-11-10 MED ORDER — AMPHETAMINE-DEXTROAMPHETAMINE 10 MG PO TABS: 10.0000 mg | ORAL_TABLET | Freq: Every day | ORAL | 0 refills | Status: DC

## 2017-11-10 NOTE — Telephone Encounter (Signed)
[facility] requesting refills on pt's Adderall RXs. Please review and send if appropriate. Thanks.

## 2017-12-15 ENCOUNTER — Other Ambulatory Visit: Payer: Self-pay | Admitting: Physician Assistant

## 2017-12-15 DIAGNOSIS — F900 Attention-deficit hyperactivity disorder, predominantly inattentive type: Secondary | ICD-10-CM

## 2017-12-15 MED ORDER — AMPHETAMINE-DEXTROAMPHETAMINE 10 MG PO TABS: 10.0000 mg | ORAL_TABLET | Freq: Every day | ORAL | 0 refills | Status: DC

## 2017-12-15 MED ORDER — AMPHETAMINE-DEXTROAMPHET ER 10 MG PO CP24: 10.0000 mg | ORAL_CAPSULE | Freq: Every day | ORAL | 0 refills | Status: DC

## 2017-12-15 NOTE — Telephone Encounter (Signed)
Last Rx on file was written on 11/19/17.

## 2017-12-21 ENCOUNTER — Ambulatory Visit: Payer: BLUE CROSS/BLUE SHIELD | Admitting: Physician Assistant

## 2017-12-22 ENCOUNTER — Ambulatory Visit: Payer: BLUE CROSS/BLUE SHIELD | Admitting: Physician Assistant

## 2018-01-12 ENCOUNTER — Other Ambulatory Visit: Payer: Self-pay | Admitting: Physician Assistant

## 2018-01-12 DIAGNOSIS — I10 Essential (primary) hypertension: Secondary | ICD-10-CM

## 2018-01-20 ENCOUNTER — Other Ambulatory Visit: Payer: Self-pay | Admitting: Physician Assistant

## 2018-01-20 DIAGNOSIS — F900 Attention-deficit hyperactivity disorder, predominantly inattentive type: Secondary | ICD-10-CM

## 2018-01-27 ENCOUNTER — Encounter: Payer: Self-pay | Admitting: Physician Assistant

## 2018-01-27 ENCOUNTER — Ambulatory Visit: Payer: BLUE CROSS/BLUE SHIELD | Admitting: Physician Assistant

## 2018-01-27 VITALS — BP 129/84 | HR 76 | Temp 98.2°F | Resp 14 | Wt 311.0 lb

## 2018-01-27 DIAGNOSIS — F900 Attention-deficit hyperactivity disorder, predominantly inattentive type: Secondary | ICD-10-CM

## 2018-01-27 DIAGNOSIS — R0683 Snoring: Secondary | ICD-10-CM

## 2018-01-27 DIAGNOSIS — I10 Essential (primary) hypertension: Secondary | ICD-10-CM

## 2018-01-27 DIAGNOSIS — Z9189 Other specified personal risk factors, not elsewhere classified: Secondary | ICD-10-CM | POA: Diagnosis not present

## 2018-01-27 DIAGNOSIS — B356 Tinea cruris: Secondary | ICD-10-CM | POA: Diagnosis not present

## 2018-01-27 DIAGNOSIS — N521 Erectile dysfunction due to diseases classified elsewhere: Secondary | ICD-10-CM | POA: Diagnosis not present

## 2018-01-27 DIAGNOSIS — R053 Chronic cough: Secondary | ICD-10-CM

## 2018-01-27 DIAGNOSIS — Z79899 Other long term (current) drug therapy: Secondary | ICD-10-CM | POA: Diagnosis not present

## 2018-01-27 DIAGNOSIS — R05 Cough: Secondary | ICD-10-CM | POA: Diagnosis not present

## 2018-01-27 MED ORDER — AMPHETAMINE-DEXTROAMPHET ER 15 MG PO CP24: 15.0000 mg | ORAL_CAPSULE | ORAL | 0 refills | Status: DC

## 2018-01-27 MED ORDER — SILDENAFIL CITRATE 20 MG PO TABS: ORAL_TABLET | ORAL | 5 refills | Status: DC

## 2018-01-27 MED ORDER — CLOTRIMAZOLE-BETAMETHASONE 1-0.05 % EX CREA: 1.0000 "application " | TOPICAL_CREAM | Freq: Two times a day (BID) | CUTANEOUS | 0 refills | Status: DC

## 2018-01-27 MED ORDER — AMPHETAMINE-DEXTROAMPHETAMINE 10 MG PO TABS: 10.0000 mg | ORAL_TABLET | Freq: Every day | ORAL | 0 refills | Status: DC

## 2018-01-27 MED ORDER — FLUCONAZOLE 150 MG PO TABS: 150.0000 mg | ORAL_TABLET | ORAL | 0 refills | Status: AC

## 2018-01-27 NOTE — Progress Notes (Signed)
HPI:                                                                Richard HammockJohn Cordova is a 42 y.o. male [facility] today for medication management  ADHD: currently taking 10 mg XR in the mornings and 10 mg IR as needed. Denies issues with moodiness, appetite, weight loss, or sleep. Denies palpitations or chest pain. Requesting to increase XR dose due to persistent concentration difficulties at work.  ED: requesting refill of Sildenafil. 3 tablets works well for him. Currently followed by endocrine for hypogonadism.  Rash: reports recurrent pruritic rash in bilateral inguinal region. Has been using OTC antifungal cream for jock itch. Reports this improves the rash, but it does not fully resolve.  He also reports 5-6 weeks of chest congestion and productive cough. Sputum was initially yellow, but is now clear. Symptoms have improved. Denies fever, chills, hemoptysis, dyspnea, chest pain.   Past Medical History:  Diagnosis Date  . ADHD (attention deficit hyperactivity disorder), inattentive type 03/07/2014   Dr. Elisabeth MostStevenson - Started on Vyvanse 20mg  July 2015, additional 10mg  in the afternoon Aug 2015, Switched to Concerta Nov 2015 caused low libido, changed to Adderall XR 20mg  February 2016   . Fatigue 07/24/2013  . Hyperlipidemia 08/09/2013   Lipitor started June 2015   . Hypotestosteronism 07/26/2013   Dose lowered June 2015, Labs Due Jan 2016   . MRSA infection 07/24/2013  . Obesity 07/24/2013  . Secondary male hypogonadism 03/09/2017   Low FSH and LH 02/2017  . Suspect osteomyelitis of fifth toe of left foot (HCC) 10/04/2017   Past Surgical History:  Procedure Laterality Date  . VASECTOMY  03/2012   Social History   Tobacco Use  . Smoking status: Never Smoker  . Smokeless tobacco: Never Used  Substance Use Topics  . Alcohol use: No   family history includes Alcoholism in his father and mother; Cancer in his father; Depression  in his father and mother; Heart attack in his paternal uncle; Prostate cancer in his father; Renal cancer in his father; Sleep apnea in his father; Testicular cancer in his father.    ROS: negative except as noted in the HPI  Medications: Current Outpatient Medications  Medication Sig Dispense Refill  . Testosterone Cypionate 200 MG/ML SOLN Inject 200 mg as directed every 14 (fourteen) days.    Marland Kitchen. albuterol (PROVENTIL HFA;VENTOLIN HFA) 108 (90 Base) MCG/ACT inhaler Inhale 2 puffs, 30 minutes prior to exercise (Patient not taking: Reported on 10/05/2017) 1 Inhaler 1  . amphetamine-dextroamphetamine (ADDERALL XR) 15 MG 24 hr capsule Take 1 capsule by mouth every morning. 30 capsule 0  . amphetamine-dextroamphetamine (ADDERALL) 10 MG tablet Take 1 tablet (10 mg total) by mouth daily with lunch. 30 tablet 0  . cetirizine (ZYRTEC) 10 MG tablet Take 1 tablet (10 mg total) by mouth daily. 90 tablet 3  . clotrimazole-betamethasone (LOTRISONE) cream Apply 1 application topically 2 (two) times daily. 45 g 0  . fluconazole (DIFLUCAN) 150 MG tablet Take 1 tablet (150 mg total) by mouth once a week for 2 doses. 2 tablet 0  . lisinopril (PRINIVIL,ZESTRIL) 10 MG tablet TAKE 1 TABLET BY MOUTH EVERY DAY 30 tablet 3  . sildenafil (  REVATIO) 20 MG tablet Take 3 tabs PO 30 minutes prior to sexual activity 30 tablet 5   No current facility-administered medications for this visit.    Allergies  Allergen Reactions  . Coconut Fatty Acids Shortness Of Breath  . Escitalopram Other (See Comments)    Low libido, ED       Objective:  BP 129/84   Pulse 76   Temp 98.2 F (36.8 C)   Resp 14   Wt (!) 311 lb (141.1 kg)   SpO2 96%   BMI 42.18 kg/m  Gen:  alert, not ill-appearing, no distress, appropriate for age, obese male HEENT: head normocephalic without obvious abnormality, conjunctiva and cornea clear, trachea midline Pulm: Normal work of breathing, normal phonation, clear to auscultation bilaterally, no  wheezes, rales or rhonchi CV: Normal rate, regular rhythm, s1 and s2 distinct, no murmurs, clicks or rubs  Neuro: alert and oriented x 3, no tremor MSK: extremities atraumatic, normal gait and station GU: exam deferred Skin: intact, no rashes on exposed skin, no jaundice, no cyanosis Psych: well-groomed, cooperative, good eye contact, euthymic mood, affect mood-congruent, speech is articulate, and thought processes clear and goal-directed    No results found for this or any previous visit (from the past 72 hour(s)). No results found.    Assessment and Plan: 42 y.o. male with   Encounter for medication management  ADHD (attention deficit hyperactivity disorder), inattentive type - Plan: amphetamine-dextroamphetamine (ADDERALL) 10 MG tablet, amphetamine-dextroamphetamine (ADDERALL XR) 15 MG 24 hr capsule  Need for diphtheria-tetanus-pertussis (Tdap) vaccine  Erectile dysfunction due to diseases classified elsewhere - Plan: sildenafil (REVATIO) 20 MG tablet  Tinea cruris - Plan: clotrimazole-betamethasone (LOTRISONE) cream, fluconazole (DIFLUCAN) 150 MG tablet  Persistent cough for 3 weeks or longer  HTN BP Readings from Last 3 Encounters:  01/27/18 129/84  10/19/17 113/74  10/07/17 122/71  - diastolic BP mildly out of range - continue Lisinopril 10 mg - counseled on therapeutic lifestyle changes - due for BMP   Adult ADHD - BP and HR wnl - increasing Adderall XR to 15 mg QAM, re-assess in 3 months  Persistent cough - afebrile, no tachypnea, no tachycardia. SpO2 96% on RA at rest, suspect some underlying obesity hypoventilation due to BMI>40, symptoms are improving clinically, no adventitious lung sounds on exam, no cough present during office visit - active surveillance, will obtain CXR if there is second sickening - also consider switching Lisinopril to ARB   Patient education and anticipatory guidance given Patient agrees with treatment plan Follow-up in 3 months  or sooner as needed if symptoms worsen or fail to improve  Levonne Hubert PA-C

## 2018-01-31 ENCOUNTER — Encounter: Payer: Self-pay | Admitting: Physician Assistant

## 2018-01-31 DIAGNOSIS — R053 Chronic cough: Secondary | ICD-10-CM | POA: Insufficient documentation

## 2018-01-31 DIAGNOSIS — R05 Cough: Secondary | ICD-10-CM | POA: Insufficient documentation

## 2018-02-02 ENCOUNTER — Telehealth: Payer: Self-pay | Admitting: Physician Assistant

## 2018-02-02 NOTE — Telephone Encounter (Signed)
Left VM requesting update. Callback information provided.

## 2018-02-02 NOTE — Telephone Encounter (Signed)
-----   Message from [facility] HospitalChar[facility] Elizabeth Cummings, [place] sent at 01/31/2018  7:49 PM EDT ----- Can you remind patient to go to the lab? We need to monitor his kidney function and electrolytes while he is on BP medication Also, ask if he was ever contacted about a home sleep study

## 2018-02-03 ENCOUNTER — Telehealth: Payer: Self-pay

## 2018-02-03 NOTE — Telephone Encounter (Signed)
Pt notified that he needs to get lab done.  He verbalized understanding.  He has not been contacted about his home sleep study.  I will contact [facility] to check status. -EH/RMA

## 2018-02-03 NOTE — Telephone Encounter (Signed)
-----   Message from Charley Elizabeth Cummings, PA-C sent at 01/31/2018  7:49 PM EDT ----- Can you remind patient to go to the lab? We need to monitor his kidney function and electrolytes while he is on BP medication Also, ask if he was ever contacted about a home sleep study 

## 2018-02-22 DIAGNOSIS — Z6841 Body Mass Index (BMI) 40.0 and over, adult: Secondary | ICD-10-CM | POA: Diagnosis not present

## 2018-02-22 DIAGNOSIS — E291 Testicular hypofunction: Secondary | ICD-10-CM | POA: Diagnosis not present

## 2018-02-25 ENCOUNTER — Other Ambulatory Visit: Payer: Self-pay | Admitting: Physician Assistant

## 2018-02-25 DIAGNOSIS — F900 Attention-deficit hyperactivity disorder, predominantly inattentive type: Secondary | ICD-10-CM

## 2018-03-01 MED ORDER — AMPHETAMINE-DEXTROAMPHET ER 15 MG PO CP24: 15.0000 mg | ORAL_CAPSULE | ORAL | 0 refills | Status: DC

## 2018-03-01 MED ORDER — AMPHETAMINE-DEXTROAMPHETAMINE 10 MG PO TABS: 10.0000 mg | ORAL_TABLET | Freq: Every day | ORAL | 0 refills | Status: DC

## 2018-03-10 IMAGING — MR MR TOES*L* WO/W CM
5 of 9 series · 19 of 40 positions shown · IV contrast (multihance)
Comparison: 10/01/2017

CLINICAL DATA: Open soft tissue wound of the left fifth toe x5
days. Incision and drainage today. Concern for osteomyelitis.

EXAM:
MRI OF THE LEFT TOES WITHOUT AND WITH CONTRAST
TECHNIQUE: Multiplanar, multisequence MR imaging of the left forefoot was
performed both before and after administration of intravenous
contrast.
CONTRAST:  20mL MULTIHANCE GADOBENATE DIMEGLUMINE 529 MG/ML IV SOLN

[Series 2: T1 · coronal · 4.0mm · 0.29mm/px · 6 of 31 slices shown (1 of 2)]
[im 1/31]
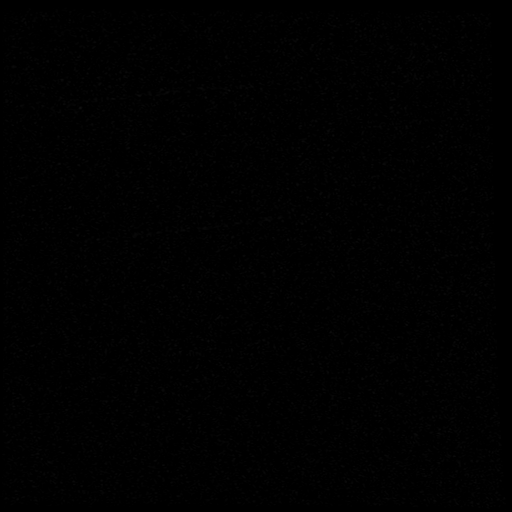
[im 7/31]
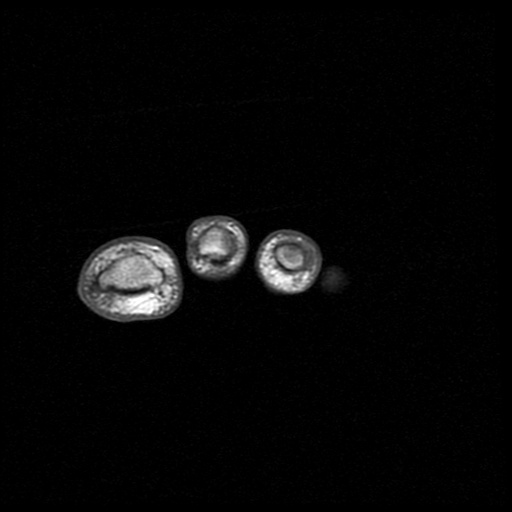
[im 13/31]
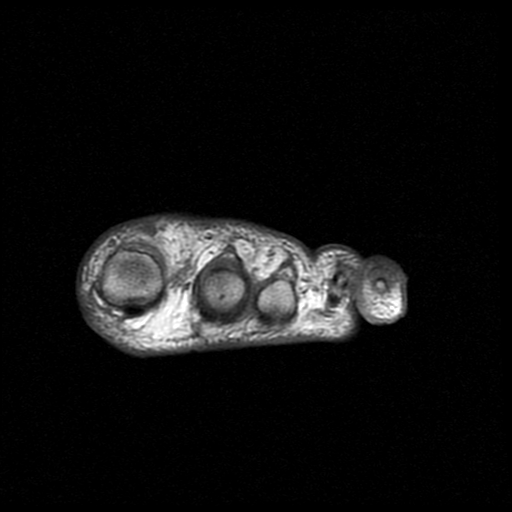
[im 19/31]
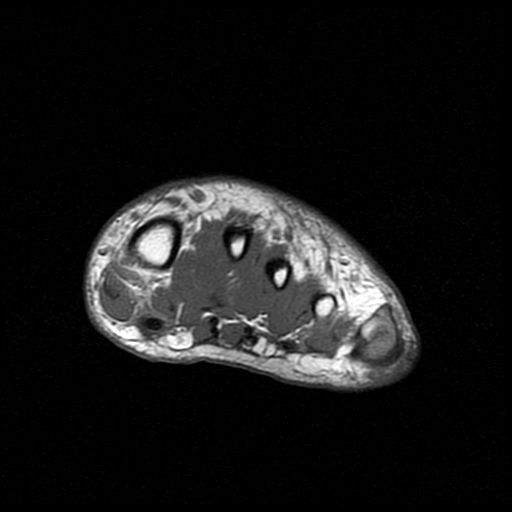
[im 25/31]
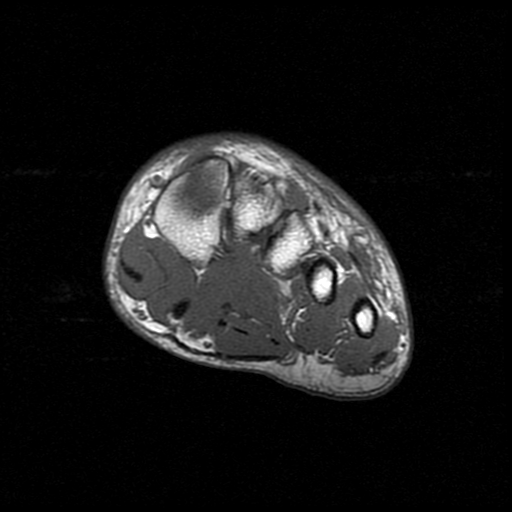
[im 31/31]
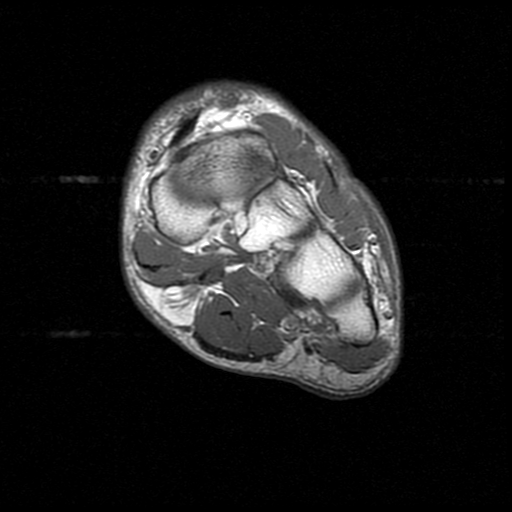

[Series 5: T1 · axial · 4.0mm · 0.33mm/px · z∈[-67,+26]mm · 3 of 20 slices shown (2 of 2)]
[im 1/20]
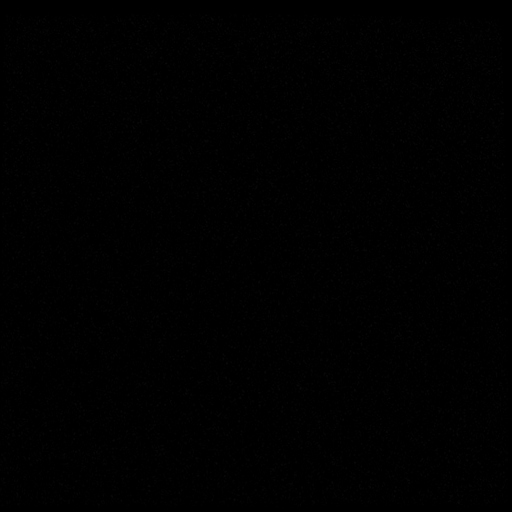
[im 10/20]
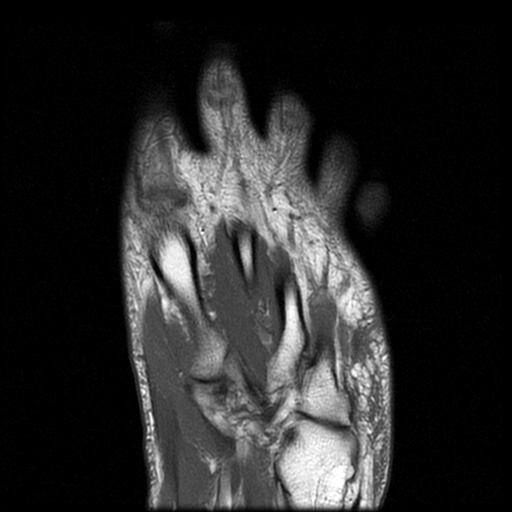
[im 20/20]
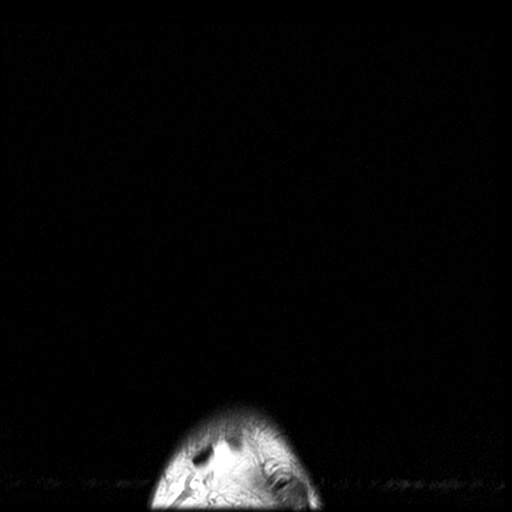

[Series 8: T1 fat-sat post-contrast · coronal · 4.0mm · 0.29mm/px · 5 of 31 slices shown]
[im 1/31]
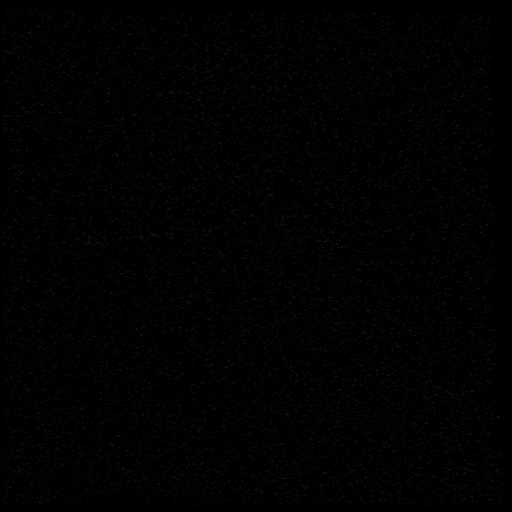
[im 8/31]
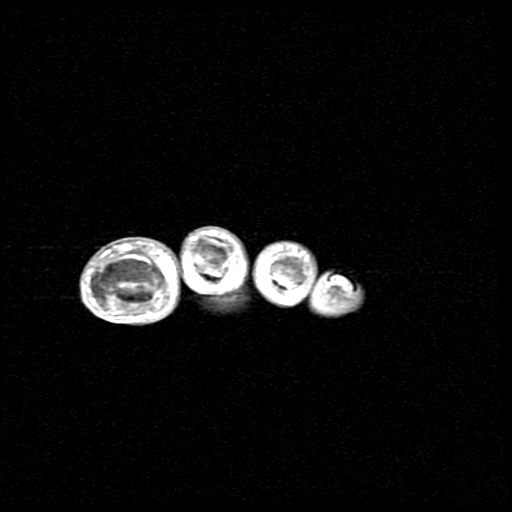
[im 16/31]
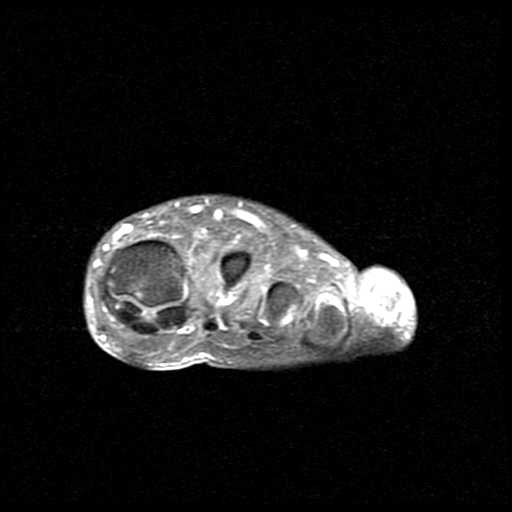
[im 23/31]
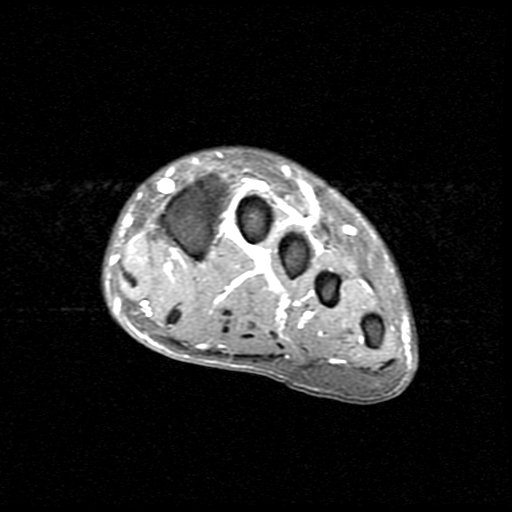
[im 31/31]
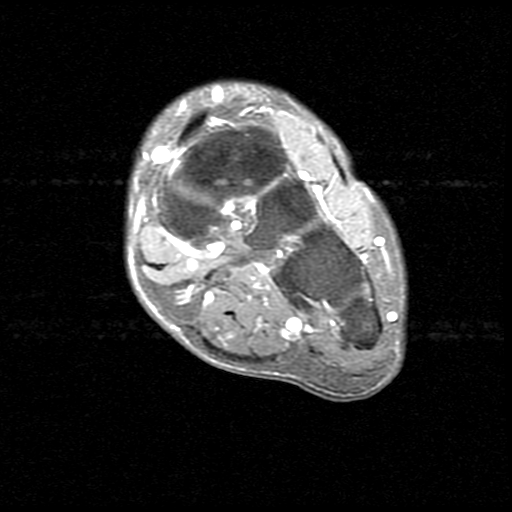

[Series 9: T1 post-contrast · axial · 4.0mm · 0.33mm/px · z∈[-67,+26]mm · 3 of 20 slices shown (1 of 2)]
[im 1/20]
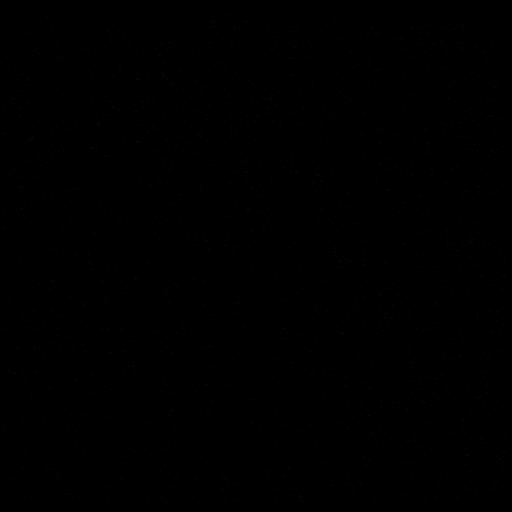
[im 10/20]
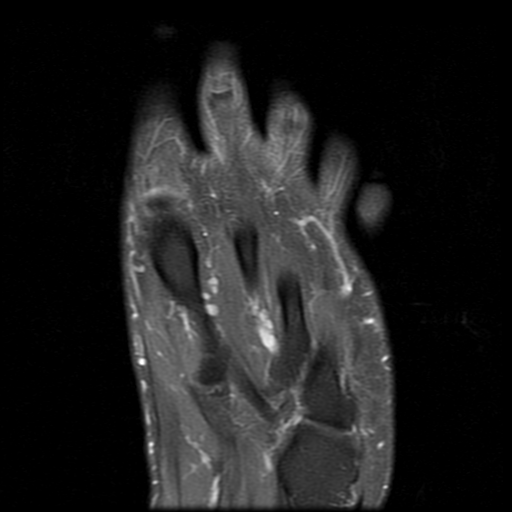
[im 20/20]
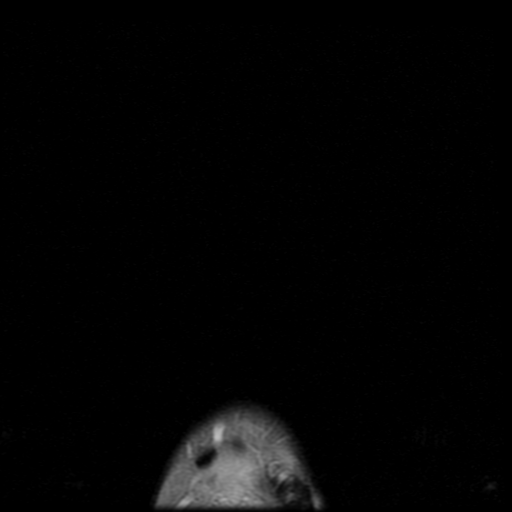

[Series 10: T1 post-contrast · sagittal · 4.0mm · 0.33mm/px · 2 of 23 slices shown (2 of 2)]
[im 1/23]
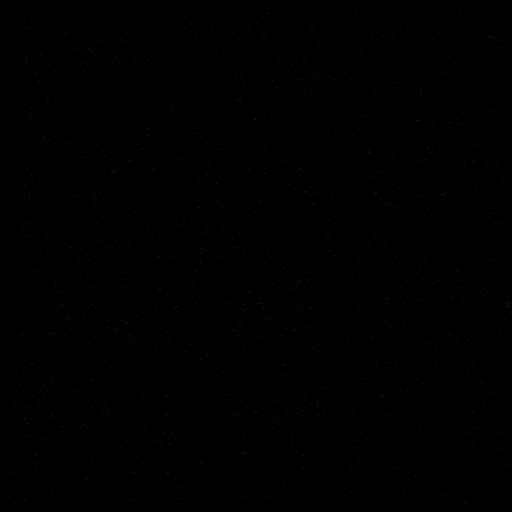
[im 8/23]
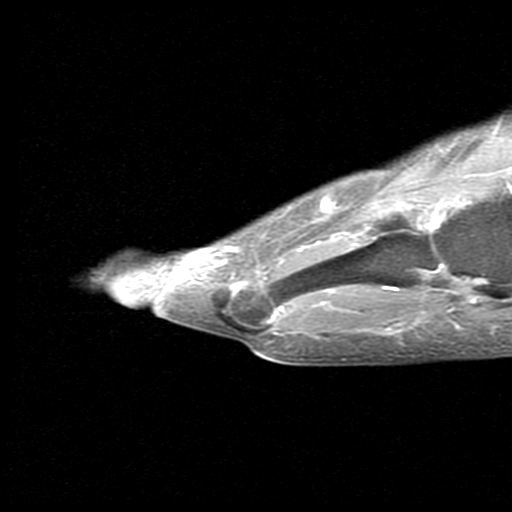

[19 of 40 positions shown; findings below may reference images not displayed]

FINDINGS: Bones/Joint/Cartilage

Marrow signal abnormalities of the left fifth toe involving the
distal and middle phalanges as well as head of the proximal phalanx
in conjunction with a surrounding soft tissue edema/cellulitis are
in keeping with acute osteomyelitis. No enhancing fluid collections.

Ligaments

Noncontributory

Muscles and Tendons

No muscle atrophy.  Extensor and flexor tendons are unremarkable.

Soft tissues

Soft tissue edema along the lateral aspect of the forefoot and about
the fifth toe compatible with cellulitis.
IMPRESSION: Marrow signal abnormalities of the left fifth toe involving the
distal and middle phalanges as well as head of the proximal phalanx.
This in conjunction with surrounding soft tissue edema are
consistent with changes of acute osteomyelitis with surrounding
cellulitis. No enhancing fluid collections to suggest a soft tissue
abscess.

## 2018-04-09 ENCOUNTER — Other Ambulatory Visit: Payer: Self-pay | Admitting: Physician Assistant

## 2018-04-09 DIAGNOSIS — F900 Attention-deficit hyperactivity disorder, predominantly inattentive type: Secondary | ICD-10-CM

## 2018-04-14 MED ORDER — AMPHETAMINE-DEXTROAMPHETAMINE 10 MG PO TABS: 10.0000 mg | ORAL_TABLET | Freq: Every day | ORAL | 0 refills | Status: DC

## 2018-04-14 MED ORDER — AMPHETAMINE-DEXTROAMPHET ER 15 MG PO CP24: 15.0000 mg | ORAL_CAPSULE | ORAL | 0 refills | Status: DC

## 2018-04-14 NOTE — Telephone Encounter (Signed)
Requesting RF on both Adderall RXs  Last written 03-01-18  RX pended, please review and send if appropriate  Thanks!

## 2018-04-16 ENCOUNTER — Other Ambulatory Visit: Payer: Self-pay | Admitting: Sports Medicine

## 2018-04-16 DIAGNOSIS — N521 Erectile dysfunction due to diseases classified elsewhere: Secondary | ICD-10-CM

## 2018-04-17 NOTE — Telephone Encounter (Signed)
To PCP

## 2018-04-19 ENCOUNTER — Other Ambulatory Visit: Payer: Self-pay | Admitting: Physician Assistant

## 2018-04-19 DIAGNOSIS — N521 Erectile dysfunction due to diseases classified elsewhere: Secondary | ICD-10-CM

## 2018-05-09 ENCOUNTER — Other Ambulatory Visit: Payer: Self-pay | Admitting: Physician Assistant

## 2018-05-09 DIAGNOSIS — I10 Essential (primary) hypertension: Secondary | ICD-10-CM

## 2018-05-12 ENCOUNTER — Other Ambulatory Visit: Payer: Self-pay | Admitting: Physician Assistant

## 2018-05-12 DIAGNOSIS — F900 Attention-deficit hyperactivity disorder, predominantly inattentive type: Secondary | ICD-10-CM

## 2018-05-12 DIAGNOSIS — B356 Tinea cruris: Secondary | ICD-10-CM

## 2018-05-12 MED ORDER — CLOTRIMAZOLE-BETAMETHASONE 1-0.05 % EX CREA: 1.0000 "application " | TOPICAL_CREAM | Freq: Two times a day (BID) | CUTANEOUS | 0 refills | Status: DC

## 2018-05-12 NOTE — Telephone Encounter (Signed)
He needs BP follow-up before I can refill his Adderall. We increased his dose 3 months ago and his BP was a little elevated at that time, so it needs to be monitored. He can do this as a nurse visit. Encourage to bring home BP readings if he has them

## 2018-05-17 ENCOUNTER — Ambulatory Visit (INDEPENDENT_AMBULATORY_CARE_PROVIDER_SITE_OTHER): Payer: BLUE CROSS/BLUE SHIELD | Admitting: Physician Assistant

## 2018-05-17 ENCOUNTER — Encounter: Payer: Self-pay | Admitting: Physician Assistant

## 2018-05-17 VITALS — BP 121/77 | HR 72 | Wt 297.0 lb

## 2018-05-17 DIAGNOSIS — Z79899 Other long term (current) drug therapy: Secondary | ICD-10-CM

## 2018-05-17 DIAGNOSIS — I10 Essential (primary) hypertension: Secondary | ICD-10-CM

## 2018-05-17 DIAGNOSIS — F900 Attention-deficit hyperactivity disorder, predominantly inattentive type: Secondary | ICD-10-CM | POA: Diagnosis not present

## 2018-05-17 DIAGNOSIS — Z6841 Body Mass Index (BMI) 40.0 and over, adult: Secondary | ICD-10-CM

## 2018-05-17 DIAGNOSIS — J309 Allergic rhinitis, unspecified: Secondary | ICD-10-CM | POA: Diagnosis not present

## 2018-05-17 DIAGNOSIS — E785 Hyperlipidemia, unspecified: Secondary | ICD-10-CM

## 2018-05-17 DIAGNOSIS — E782 Mixed hyperlipidemia: Secondary | ICD-10-CM | POA: Diagnosis not present

## 2018-05-17 DIAGNOSIS — Z131 Encounter for screening for diabetes mellitus: Secondary | ICD-10-CM

## 2018-05-17 DIAGNOSIS — Z130 Encounter for screening for diseases of the blood and blood-forming organs and certain disorders involving the immune mechanism: Secondary | ICD-10-CM

## 2018-05-17 MED ORDER — AMPHETAMINE-DEXTROAMPHET ER 15 MG PO CP24: 15.0000 mg | ORAL_CAPSULE | ORAL | 0 refills | Status: DC

## 2018-05-17 MED ORDER — CROMOLYN SODIUM 5.2 MG/ACT NA AERS: 1.0000 | INHALATION_SPRAY | Freq: Three times a day (TID) | NASAL | 5 refills | Status: DC

## 2018-05-17 MED ORDER — AMPHETAMINE-DEXTROAMPHETAMINE 10 MG PO TABS: 10.0000 mg | ORAL_TABLET | Freq: Every day | ORAL | 0 refills | Status: DC

## 2018-05-17 MED ORDER — MONTELUKAST SODIUM 10 MG PO TABS: 10.0000 mg | ORAL_TABLET | Freq: Every day | ORAL | 3 refills | Status: DC

## 2018-05-17 NOTE — Progress Notes (Signed)
HPI:                                                                Richard Cordova is a 42 y.o. male who presents to [facility]: Primary Care Sports Medicine today for medication management   HTN: taking Lisinopril 10 mg daily. Compliant with medications. Does not check BP's at home. He signed up for [employer]. He is walking 5,000-10,000 steps per day. He has lost about 20 pounds. Denies vision change, headache, chest pain with exertion, orthopnea, syncope and edema. Occasional spells of lightheadedness described as "feeling spacier than normal." Risk factors include: obesity, HLD  ADHD: currently taking 15 mg XR in the mornings and 10 mg IR as needed. Denies issues appetite or sleep. Denies palpitations or chest pain.   Depression screen [facility] 2/9 05/17/2018 10/19/2017 10/05/2017 09/21/2017 04/04/2017  Decreased Interest 0 0 0 0 3  Down, Depressed, Hopeless 3 0 0 0 3  PHQ - 2 Score 3 0 0 0 6  Altered sleeping 1 - - - 3  Tired, decreased energy 1 - - - 3  Change in appetite 0 - - - 3  Feeling bad or failure about yourself  0 - - - 3  Trouble concentrating 0 - - - 0  Moving slowly or fidgety/restless 0 - - - 0  Suicidal thoughts 0 - - - 0  PHQ-9 Score 5 - - - 18    GAD 7 : Generalized Anxiety Score 04/04/2017  Nervous, Anxious, on Edge 3  Control/stop worrying 3  Worry too much - different things 2  Trouble relaxing 3  Restless 0  Easily annoyed or irritable 3  Afraid - awful might happen 3  Total GAD 7 Score 17      Past Medical History:  Diagnosis Date  . ADHD (attention deficit hyperactivity disorder), inattentive type 03/07/2014   Dr. Elisabeth Most - Started on Vyvanse 20mg  July 2015, additional 10mg  in the afternoon Aug 2015, Switched to Concerta Nov 2015 caused low libido, changed to Adderall XR 20mg  February 2016   . Fatigue 07/24/2013  . Hyperlipidemia 08/09/2013   Lipitor started June 2015   . Hypotestosteronism 07/26/2013   Dose lowered June 2015, Labs Due Jan 2016    . MRSA infection 07/24/2013  . Obesity 07/24/2013  . Secondary male hypogonadism 03/09/2017   Low FSH and LH 02/2017  . Suspect osteomyelitis of fifth toe of left foot (HCC) 10/04/2017   Past Surgical History:  Procedure Laterality Date  . VASECTOMY  03/2012   Social History   Tobacco Use  . Smoking status: Never Smoker  . Smokeless tobacco: Never Used  Substance Use Topics  . Alcohol use: No   family history includes Alcoholism in his father and mother; Cancer in his father; Depression in his father and mother; Heart attack in his paternal uncle; Prostate cancer in his father; Renal cancer in his father; Sleep apnea in his father; Testicular cancer in his father.    ROS: Review of Systems  Endo/Heme/Allergies: Positive for environmental allergies.     Medications: Current Outpatient Medications  Medication Sig Dispense Refill  . Testosterone (ANDROGEL PUMP) 20.25 MG/ACT (1.62%) GEL Place 2 Pump onto the skin daily.    Marland Kitchen amphetamine-dextroamphetamine (ADDERALL XR) 15 MG  24 hr capsule Take 1 capsule by mouth every morning. 30 capsule 0  . amphetamine-dextroamphetamine (ADDERALL) 10 MG tablet Take 1 tablet (10 mg total) by mouth daily with lunch. 30 tablet 0  . cetirizine (ZYRTEC) 10 MG tablet Take 1 tablet (10 mg total) by mouth daily. 90 tablet 3  . clotrimazole-betamethasone (LOTRISONE) cream Apply 1 application topically 2 (two) times daily. 45 g 0  . cromolyn (NASALCROM) 5.2 MG/ACT nasal spray Place 1 spray into both nostrils 3 (three) times daily. In each nostrl 26 mL 5  . lisinopril (PRINIVIL,ZESTRIL) 10 MG tablet Take 1 tablet (10 mg total) by mouth daily. 90 tablet 1  . montelukast (SINGULAIR) 10 MG tablet Take 1 tablet (10 mg total) by mouth at bedtime. 90 tablet 3  . sildenafil (REVATIO) 20 MG tablet TAKE 1-5 TABS BY MOUTH 30 MINUTES PRIOR TO SEXUAL ACTIVITY 90 tablet 0   No current facility-administered medications for this visit.    Allergies  Allergen Reactions  .  Coconut Fatty Acids Shortness Of Breath  . Escitalopram Other (See Comments)    Low libido, ED       Objective:  BP 121/77   Pulse 72   Wt 297 lb (134.7 kg)   BMI 40.28 kg/m  Gen:  alert, not ill-appearing, no distress, appropriate for age, obese male HEENT: head normocephalic without obvious abnormality, conjunctiva and cornea clear, trachea midline Pulm: Normal work of breathing, normal phonation, clear to auscultation bilaterally, no wheezes, rales or rhonchi CV: Normal rate, regular rhythm, s1 and s2 distinct, no murmurs, clicks or rubs  Neuro: alert and oriented x 3, no tremor MSK: extremities atraumatic, normal gait and station, no peripheral edema Skin: intact, no rashes on exposed skin, no jaundice, no cyanosis Psych: well-groomed, cooperative, good eye contact, euthymic mood, affect mood-congruent, speech is articulate, and thought processes clear and goal-directed    No results found for this or any previous visit (from the past 72 hour(s)). No results found.    Assessment and Plan: 42 y.o. male with   .Joon was seen today for medication management.  Diagnoses and all orders for this visit:  Encounter for long-term (current) use of medications -     Hemoglobin A1c -     CBC -     Lipid Panel w/reflex Direct LDL -     Comprehensive metabolic panel  ADHD (attention deficit hyperactivity disorder), inattentive type -     amphetamine-dextroamphetamine (ADDERALL XR) 15 MG 24 hr capsule; Take 1 capsule by mouth every morning. -     amphetamine-dextroamphetamine (ADDERALL) 10 MG tablet; Take 1 tablet (10 mg total) by mouth daily with lunch.  Allergic rhinitis, unspecified seasonality, unspecified trigger -     montelukast (SINGULAIR) 10 MG tablet; Take 1 tablet (10 mg total) by mouth at bedtime. -     cromolyn (NASALCROM) 5.2 MG/ACT nasal spray; Place 1 spray into both nostrils 3 (three) times daily. In each nostrl  Hypertension goal BP (blood pressure) <  130/80 -     Comprehensive metabolic panel -     lisinopril (PRINIVIL,ZESTRIL) 10 MG tablet; Take 1 tablet (10 mg total) by mouth daily.  Borderline hyperlipidemia -     Lipid Panel w/reflex Direct LDL  Class 3 severe obesity due to excess calories with serious comorbidity and body mass index (BMI) of 45.0 to 49.9 in adult (HCC) -     Hemoglobin A1c  Screening for blood disease -     CBC  Screening for  diabetes mellitus -     Hemoglobin A1c    Patient education and anticipatory guidance given Patient agrees with treatment plan Follow-up in 3 months for med mgmt or sooner as needed if symptoms worsen or fail to improve  Levonne Hubert PA-C

## 2018-05-18 LAB — COMPREHENSIVE METABOLIC PANEL
AG RATIO: 1.7 (calc) (ref 1.0–2.5)
ALT: 26 U/L (ref 9–46)
AST: 19 U/L (ref 10–40)
Albumin: 4.5 g/dL (ref 3.6–5.1)
Alkaline phosphatase (APISO): 72 U/L (ref 40–115)
BILIRUBIN TOTAL: 1 mg/dL (ref 0.2–1.2)
BUN: 14 mg/dL (ref 7–25)
CO2: 26 mmol/L (ref 20–32)
Calcium: 9.5 mg/dL (ref 8.6–10.3)
Chloride: 102 mmol/L (ref 98–110)
Creat: 0.88 mg/dL (ref 0.60–1.35)
GLUCOSE: 91 mg/dL (ref 65–99)
Globulin: 2.6 g/dL (calc) (ref 1.9–3.7)
Potassium: 4.5 mmol/L (ref 3.5–5.3)
SODIUM: 137 mmol/L (ref 135–146)
TOTAL PROTEIN: 7.1 g/dL (ref 6.1–8.1)

## 2018-05-18 LAB — LIPID PANEL W/REFLEX DIRECT LDL
CHOL/HDL RATIO: 4.1 (calc) (ref <5.0)
Cholesterol: 202 mg/dL — ABNORMAL HIGH (ref <200)
HDL: 49 mg/dL (ref >40)
LDL CHOLESTEROL (CALC): 134 mg/dL — AB
Non-HDL Cholesterol (Calc): 153 mg/dL (calc) — ABNORMAL HIGH (ref <130)
TRIGLYCERIDES: 91 mg/dL (ref <150)

## 2018-05-18 LAB — CBC
HCT: 45 % (ref 38.5–50.0)
Hemoglobin: 15.6 g/dL (ref 13.2–17.1)
MCH: 28 pg (ref 27.0–33.0)
MCHC: 34.7 g/dL (ref 32.0–36.0)
MCV: 80.6 fL (ref 80.0–100.0)
MPV: 11.2 fL (ref 7.5–12.5)
Platelets: 231 10*3/uL (ref 140–400)
RBC: 5.58 10*6/uL (ref 4.20–5.80)
RDW: 13.1 % (ref 11.0–15.0)
WBC: 10.4 10*3/uL (ref 3.8–10.8)

## 2018-05-18 LAB — HEMOGLOBIN A1C
Hgb A1c MFr Bld: 5 % of total Hgb (ref <5.7)
Mean Plasma Glucose: 97 (calc)
eAG (mmol/L): 5.4 (calc)

## 2018-05-24 DIAGNOSIS — E291 Testicular hypofunction: Secondary | ICD-10-CM | POA: Diagnosis not present

## 2018-05-24 DIAGNOSIS — Z6841 Body Mass Index (BMI) 40.0 and over, adult: Secondary | ICD-10-CM | POA: Diagnosis not present

## 2018-05-30 ENCOUNTER — Encounter: Payer: Self-pay | Admitting: Physician Assistant

## 2018-05-30 MED ORDER — LISINOPRIL 10 MG PO TABS: 10.0000 mg | ORAL_TABLET | Freq: Every day | ORAL | 1 refills | Status: DC

## 2018-06-20 ENCOUNTER — Other Ambulatory Visit: Payer: Self-pay | Admitting: Physician Assistant

## 2018-06-20 DIAGNOSIS — F900 Attention-deficit hyperactivity disorder, predominantly inattentive type: Secondary | ICD-10-CM

## 2018-06-28 ENCOUNTER — Other Ambulatory Visit: Payer: Self-pay | Admitting: Physician Assistant

## 2018-06-28 DIAGNOSIS — F900 Attention-deficit hyperactivity disorder, predominantly inattentive type: Secondary | ICD-10-CM

## 2018-06-30 MED ORDER — AMPHETAMINE-DEXTROAMPHET ER 15 MG PO CP24: 15.0000 mg | ORAL_CAPSULE | ORAL | 0 refills | Status: DC

## 2018-06-30 MED ORDER — AMPHETAMINE-DEXTROAMPHETAMINE 10 MG PO TABS: 10.0000 mg | ORAL_TABLET | Freq: Every day | ORAL | 0 refills | Status: DC

## 2018-07-03 ENCOUNTER — Other Ambulatory Visit: Payer: Self-pay | Admitting: Physician Assistant

## 2018-07-03 DIAGNOSIS — B356 Tinea cruris: Secondary | ICD-10-CM

## 2018-07-05 MED ORDER — CLOTRIMAZOLE-BETAMETHASONE 1-0.05 % EX CREA: 1.0000 "application " | TOPICAL_CREAM | Freq: Two times a day (BID) | CUTANEOUS | 0 refills | Status: DC

## 2018-07-25 DIAGNOSIS — E291 Testicular hypofunction: Secondary | ICD-10-CM | POA: Diagnosis not present

## 2018-07-26 ENCOUNTER — Other Ambulatory Visit: Payer: Self-pay | Admitting: Physician Assistant

## 2018-07-26 DIAGNOSIS — F900 Attention-deficit hyperactivity disorder, predominantly inattentive type: Secondary | ICD-10-CM

## 2018-07-31 MED ORDER — AMPHETAMINE-DEXTROAMPHET ER 15 MG PO CP24: 15.0000 mg | ORAL_CAPSULE | ORAL | 0 refills | Status: DC

## 2018-07-31 MED ORDER — AMPHETAMINE-DEXTROAMPHETAMINE 10 MG PO TABS: 10.0000 mg | ORAL_TABLET | Freq: Every day | ORAL | 0 refills | Status: DC

## 2018-08-29 ENCOUNTER — Encounter: Payer: Self-pay | Admitting: Physician Assistant

## 2018-08-29 DIAGNOSIS — B001 Herpesviral vesicular dermatitis: Secondary | ICD-10-CM

## 2018-08-29 MED ORDER — VALACYCLOVIR HCL 1 G PO TABS: 2000.0000 mg | ORAL_TABLET | Freq: Two times a day (BID) | ORAL | 5 refills | Status: AC

## 2018-09-12 ENCOUNTER — Other Ambulatory Visit: Payer: Self-pay | Admitting: Physician Assistant

## 2018-09-12 ENCOUNTER — Encounter: Payer: Self-pay | Admitting: Physician Assistant

## 2018-09-12 ENCOUNTER — Ambulatory Visit (INDEPENDENT_AMBULATORY_CARE_PROVIDER_SITE_OTHER): Payer: BLUE CROSS/BLUE SHIELD | Admitting: Physician Assistant

## 2018-09-12 VITALS — BP 117/79 | HR 98 | Resp 14 | Wt 313.0 lb

## 2018-09-12 DIAGNOSIS — I10 Essential (primary) hypertension: Secondary | ICD-10-CM | POA: Diagnosis not present

## 2018-09-12 DIAGNOSIS — F900 Attention-deficit hyperactivity disorder, predominantly inattentive type: Secondary | ICD-10-CM

## 2018-09-12 DIAGNOSIS — Z79899 Other long term (current) drug therapy: Secondary | ICD-10-CM

## 2018-09-12 NOTE — Progress Notes (Signed)
HPI:                                                                Richard Cordova is a 43 y.o. male who presents to [facility]: Primary Care Sports Medicine today for medication refills  HTN: taking Lisinopril 10 mg daily. Compliant with medications. Does not check BP's at home. Denies vision change, headache, chest pain with exertion, orthopnea, syncope and edema. Occasional spells of lightheadedness described as "feeling spacier than normal." Risk factors include: obesity, HLD  ADHD: currently taking 15 mg XR in the mornings and 10 mg IR as needed. Denies issuesappetite or sleep. Denies palpitations or chest pain.   Also reports he recently got over a bad cold and has been having intermittent blood-tinged nasal secretions when he blows his nose.  He has been using an over-the-counter nasal saline spray.    Past Medical History:  Diagnosis Date  . ADHD (attention deficit hyperactivity disorder), inattentive type 03/07/2014   Dr. Elisabeth Most - Started on Vyvanse 20mg  July 2015, additional 10mg  in the afternoon Aug 2015, Switched to Concerta Nov 2015 caused low libido, changed to Adderall XR 20mg  February 2016   . Fatigue 07/24/2013  . Hyperlipidemia 08/09/2013   Lipitor started June 2015   . Hypotestosteronism 07/26/2013   Dose lowered June 2015, Labs Due Jan 2016   . MRSA infection 07/24/2013  . Obesity 07/24/2013  . Secondary male hypogonadism 03/09/2017   Low FSH and LH 02/2017  . Suspect osteomyelitis of fifth toe of left foot (HCC) 10/04/2017   Past Surgical History:  Procedure Laterality Date  . VASECTOMY  03/2012   Social History   Tobacco Use  . Smoking status: Never Smoker  . Smokeless tobacco: Never Used  Substance Use Topics  . Alcohol use: No   family history includes Alcoholism in his father and mother; Cancer in his father; Depression in his father and mother; Heart attack in his paternal uncle; Prostate cancer in his father; Renal cancer in his father;  Sleep apnea in his father; Testicular cancer in his father.    ROS: negative except as noted in the HPI  Medications: Current Outpatient Medications  Medication Sig Dispense Refill  . amphetamine-dextroamphetamine (ADDERALL XR) 15 MG 24 hr capsule Take 1 capsule by mouth every morning. 30 capsule 0  . amphetamine-dextroamphetamine (ADDERALL) 10 MG tablet Take 1 tablet (10 mg total) by mouth daily with lunch. 30 tablet 0  . cetirizine (ZYRTEC) 10 MG tablet Take 1 tablet (10 mg total) by mouth daily. 90 tablet 3  . clotrimazole-betamethasone (LOTRISONE) cream Apply 1 application topically 2 (two) times daily. 45 g 0  . cromolyn (NASALCROM) 5.2 MG/ACT nasal spray Place 1 spray into both nostrils 3 (three) times daily. In each nostrl 26 mL 5  . lisinopril (PRINIVIL,ZESTRIL) 10 MG tablet Take 1 tablet (10 mg total) by mouth daily. 90 tablet 1  . montelukast (SINGULAIR) 10 MG tablet Take 1 tablet (10 mg total) by mouth at bedtime. 90 tablet 3  . sildenafil (REVATIO) 20 MG tablet TAKE 1-5 TABS BY MOUTH 30 MINUTES PRIOR TO SEXUAL ACTIVITY 90 tablet 0  . Testosterone (ANDROGEL PUMP) 20.25 MG/ACT (1.62%) GEL Place 2 Pump onto the skin daily.     No current facility-administered  medications for this visit.    Allergies  Allergen Reactions  . Coconut Fatty Acids Shortness Of Breath  . Escitalopram Other (See Comments)    Low libido, ED       Objective:  BP 117/79   Pulse 98   Resp 14   Wt (!) 313 lb (142 kg)   BMI 42.45 kg/m  Gen:  alert, not ill-appearing, no distress, appropriate for age, obese male HEENT: head normocephalic without obvious abnormality, conjunctiva and cornea clear, trachea midline Pulm: Normal work of breathing, normal phonation, clear to auscultation bilaterally, no wheezes, rales or rhonchi CV: Normal rate, regular rhythm, s1 and s2 distinct, no murmurs, clicks or rubs  Neuro: alert and oriented x 3, no tremor MSK: extremities atraumatic, normal gait and station,  no peripheral edema Skin: intact, no rashes on exposed skin, no jaundice, no cyanosis Psych: well-groomed, cooperative, good eye contact, euthymic mood, affect mood-congruent, speech is articulate, and thought processes clear and goal-directed  Lab Results  Component Value Date   CREATININE 0.88 05/17/2018   BUN 14 05/17/2018   NA 137 05/17/2018   K 4.5 05/17/2018   CL 102 05/17/2018   CO2 26 05/17/2018   Lab Results  Component Value Date   ALT 26 05/17/2018   AST 19 05/17/2018   ALKPHOS 74 03/07/2017   BILITOT 1.0 05/17/2018   Lab Results  Component Value Date   CHOL 202 (H) 05/17/2018   HDL 49 05/17/2018   LDLCALC 134 (H) 05/17/2018   TRIG 91 05/17/2018   CHOLHDL 4.1 05/17/2018   The 10-year ASCVD risk score Denman George(Goff DC Jr., et al., 2013) is: 1.3%   Values used to calculate the score:     Age: 89 years     Sex: Male     Is Non-Hispanic African American: No     Diabetic: No     Tobacco smoker: No     Systolic Blood Pressure: 117 mmHg     Is BP treated: Yes     HDL Cholesterol: 49 mg/dL     Total Cholesterol: 202 mg/dL   No results found for this or any previous visit (from the past 72 hour(s)). No results found.    Assessment and Plan: 43 y.o. male with   .Diagnoses and all orders for this visit:  Encounter for long-term (current) use of medications  ADHD (attention deficit hyperactivity disorder), inattentive type  Hypertension goal BP (blood pressure) < 130/80   Hypertension - BP in range.  Continue lisinopril  ADHD - provided with written prescription for Adderall XR and Adderall IR  Epistaxis -blood-tinged nasal secretions associated with forceful blowing.  Patient counseled on avoiding forceful blowing, moisturizing nasal passages, sleeping with coolmist humidifier in bedroom and avoiding over-the-counter antihistamines and nasal steroids which can be overly drying.  Okay to continue nasal saline and he was also counseled he can use Afrin after an  acute nosebleed  Patient education and anticipatory guidance given Patient agrees with treatment plan Follow-up in 3 months or sooner as needed if symptoms worsen or fail to improve  Levonne Hubertharley E.  PA-C

## 2018-10-16 ENCOUNTER — Other Ambulatory Visit: Payer: Self-pay | Admitting: Physician Assistant

## 2018-10-16 DIAGNOSIS — F900 Attention-deficit hyperactivity disorder, predominantly inattentive type: Secondary | ICD-10-CM

## 2018-10-16 MED ORDER — AMPHETAMINE-DEXTROAMPHET ER 15 MG PO CP24: 15.0000 mg | ORAL_CAPSULE | ORAL | 0 refills | Status: DC

## 2018-10-16 MED ORDER — AMPHETAMINE-DEXTROAMPHETAMINE 10 MG PO TABS: 10.0000 mg | ORAL_TABLET | Freq: Every day | ORAL | 0 refills | Status: DC

## 2018-11-04 ENCOUNTER — Other Ambulatory Visit: Payer: Self-pay | Admitting: Physician Assistant

## 2018-11-04 DIAGNOSIS — J45990 Exercise induced bronchospasm: Secondary | ICD-10-CM

## 2018-12-05 ENCOUNTER — Other Ambulatory Visit: Payer: Self-pay | Admitting: Physician Assistant

## 2018-12-05 DIAGNOSIS — N521 Erectile dysfunction due to diseases classified elsewhere: Secondary | ICD-10-CM

## 2018-12-07 MED ORDER — SILDENAFIL CITRATE 20 MG PO TABS: ORAL_TABLET | ORAL | 0 refills | Status: DC

## 2018-12-07 NOTE — Telephone Encounter (Signed)
Richard Cordova is requesting a 90 day supply. I called and left a message for him to schedule a follow up appointment.

## 2019-01-26 ENCOUNTER — Other Ambulatory Visit: Payer: Self-pay | Admitting: Physician Assistant

## 2019-01-26 DIAGNOSIS — I10 Essential (primary) hypertension: Secondary | ICD-10-CM

## 2019-02-27 ENCOUNTER — Telehealth: Payer: Self-pay | Admitting: Physician Assistant

## 2019-02-27 ENCOUNTER — Ambulatory Visit (INDEPENDENT_AMBULATORY_CARE_PROVIDER_SITE_OTHER): Payer: BC Managed Care – PPO | Admitting: Physician Assistant

## 2019-02-27 ENCOUNTER — Encounter: Payer: Self-pay | Admitting: Physician Assistant

## 2019-02-27 ENCOUNTER — Other Ambulatory Visit: Payer: Self-pay

## 2019-02-27 VITALS — Temp 97.8°F | Wt 298.0 lb

## 2019-02-27 DIAGNOSIS — Z1331 Encounter for screening for depression: Secondary | ICD-10-CM | POA: Diagnosis not present

## 2019-02-27 DIAGNOSIS — Z79899 Other long term (current) drug therapy: Secondary | ICD-10-CM | POA: Diagnosis not present

## 2019-02-27 DIAGNOSIS — I10 Essential (primary) hypertension: Secondary | ICD-10-CM

## 2019-02-27 DIAGNOSIS — N521 Erectile dysfunction due to diseases classified elsewhere: Secondary | ICD-10-CM

## 2019-02-27 DIAGNOSIS — Z760 Encounter for issue of repeat prescription: Secondary | ICD-10-CM | POA: Diagnosis not present

## 2019-02-27 DIAGNOSIS — N528 Other male erectile dysfunction: Secondary | ICD-10-CM

## 2019-02-27 DIAGNOSIS — F909 Attention-deficit hyperactivity disorder, unspecified type: Secondary | ICD-10-CM

## 2019-02-27 MED ORDER — AMPHETAMINE-DEXTROAMPHET ER 15 MG PO CP24: 15.0000 mg | ORAL_CAPSULE | ORAL | 0 refills | Status: DC

## 2019-02-27 MED ORDER — SILDENAFIL CITRATE 20 MG PO TABS: ORAL_TABLET | ORAL | 0 refills | Status: DC

## 2019-02-27 MED ORDER — AMPHETAMINE-DEXTROAMPHETAMINE 10 MG PO TABS: 10.0000 mg | ORAL_TABLET | Freq: Every day | ORAL | 0 refills | Status: DC

## 2019-02-27 NOTE — Progress Notes (Signed)
Virtual Visit via Video Note  I connected with Richard Cordova on 02/27/19 at 11:10 AM EDT by a video enabled telemedicine application and verified that I am speaking with the correct person using two identifiers.   I discussed the limitations of evaluation and management by telemedicine and the availability of in person appointments. The patient expressed understanding and agreed to proceed.  History of Present Illness: HPI:                                                                Richard HammockJohn Karis is a 43 y.o. male   CC: medication refills  HTN: takingLisinopril 10 mgdaily.Compliant with medications.Does not check BP's at home. Reports he lost his blood pressure cuff. Reports resting heart rate is typically about 68 on his Fitbit.   Denies vision change, headache, chest painwith exertion, orthopnea, syncopeand edema.Occasional spells of lightheadedness described as "feeling spacier than normal."Risk factors include:obesity, HLD  ADHD: currently taking 15mg  XR in the mornings and 10 mg immediate release in the afternoon.  Denies chest pain, irregular beats, palpitations.  Denies headaches, mood changes, sleep disturbance.   ED: Takes sildenafil 3 tablets (60 mg) as needed.  This is working well for him.  Denies any change in symptoms.  Requesting a refill today.  [facility] 02/27/2019 05/17/2018 10/19/2017 10/05/2017 09/21/2017  Decreased Interest 0 0 0 0 0  Down, Depressed, Hopeless 0 3 0 0 0  PHQ - 2 Score 0 3 0 0 0  Altered sleeping 0 1 - - -  Tired, decreased energy 0 1 - - -  Change in appetite 0 0 - - -  Feeling bad or failure about yourself  0 0 - - -  Trouble concentrating 0 0 - - -  Moving slowly or fidgety/restless 0 0 - - -  Suicidal thoughts 0 0 - - -  PHQ-9 Score 0 5 - - -    GAD 7 : Generalized Anxiety Score 02/27/2019 04/04/2017  Nervous, Anxious, on Edge 0 3  Control/stop worrying 0 3  Worry too much - different things 0 2  Trouble relaxing 0 3   Restless 0 0  Easily annoyed or irritable 0 3  Afraid - awful might happen 0 3  Total GAD 7 Score 0 17      Past Medical History:  Diagnosis Date  . ADHD (attention deficit hyperactivity disorder), inattentive type 03/07/2014   Dr. Elisabeth MostStevenson - Started on Vyvanse 20mg  July 2015, additional 10mg  in the afternoon Aug 2015, Switched to Concerta Nov 2015 caused low libido, changed to Adderall XR 20mg  February 2016   . Fatigue 07/24/2013  . Hyperlipidemia 08/09/2013   Lipitor started June 2015   . Hypotestosteronism 07/26/2013   Dose lowered June 2015, Labs Due Jan 2016   . MRSA infection 07/24/2013  . Obesity 07/24/2013  . Secondary male hypogonadism 03/09/2017   Low FSH and LH 02/2017  . Suspect osteomyelitis of fifth toe of left foot (HCC) 10/04/2017   Past Surgical History:  Procedure Laterality Date  . VASECTOMY  03/2012   Social History   Tobacco Use  . Smoking status: Never Smoker  . Smokeless tobacco: Never Used  Substance Use Topics  . Alcohol use: No   family history includes Alcoholism  in his father and mother; Cancer in his father; Depression in his father and mother; Heart attack in his paternal uncle; Prostate cancer in his father; Renal cancer in his father; Sleep apnea in his father; Testicular cancer in his father.    ROS: negative except as noted in the HPI  Medications: Current Outpatient Medications  Medication Sig Dispense Refill  . amphetamine-dextroamphetamine (ADDERALL XR) 15 MG 24 hr capsule Take 1 capsule by mouth every morning. 30 capsule 0  . amphetamine-dextroamphetamine (ADDERALL) 10 MG tablet Take 1 tablet (10 mg total) by mouth daily with lunch. 30 tablet 0  . cetirizine (ZYRTEC) 10 MG tablet Take 1 tablet (10 mg total) by mouth daily. 90 tablet 3  . lisinopril (ZESTRIL) 10 MG tablet Take 1 tablet (10 mg total) by mouth daily. NEEDS APPT 90 tablet 0  . montelukast (SINGULAIR) 10 MG tablet Take 1 tablet (10 mg total) by mouth at bedtime. 90 tablet 3   . sildenafil (REVATIO) 20 MG tablet TAKE 1-5 TABS BY MOUTH 30 MINUTES PRIOR TO SEXUAL ACTIVITY 90 tablet 0  . clotrimazole-betamethasone (LOTRISONE) cream Apply 1 application topically 2 (two) times daily. 45 g 0  . cromolyn (NASALCROM) 5.2 MG/ACT nasal spray Place 1 spray into both nostrils 3 (three) times daily. In each nostrl 26 mL 5  . Testosterone (ANDROGEL PUMP) 20.25 MG/ACT (1.62%) GEL Place 2 Pump onto the skin daily.     No current facility-administered medications for this visit.    Allergies  Allergen Reactions  . Coconut Fatty Acids Shortness Of Breath  . Escitalopram Other (See Comments)    Low libido, ED       Objective:  Temp 97.8 F (36.6 C) (Tympanic)   Wt 298 lb (135.2 kg)   BMI 40.42 kg/m  BP Readings from Last 3 Encounters:  09/12/18 117/79  05/17/18 121/77  01/27/18 129/84   Wt Readings from Last 3 Encounters:  02/27/19 298 lb (135.2 kg)  09/12/18 (!) 313 lb (142 kg)  05/17/18 297 lb (134.7 kg)    Gen:  alert, not ill-appearing, no distress, appropriate for age HEENT: head normocephalic without obvious abnormality, conjunctiva and cornea clear, trachea midline Pulm: Normal work of breathing, normal phonation Neuro: alert and oriented x 3 Psych: cooperative, euthymic mood, affect mood-congruent, speech is articulate, normal rate and volume; thought processes clear and goal-directed, normal judgment, good insight   Lab Results  Component Value Date   CREATININE 0.88 05/17/2018   BUN 14 05/17/2018   NA 137 05/17/2018   K 4.5 05/17/2018   CL 102 05/17/2018   CO2 26 05/17/2018   Lab Results  Component Value Date   ALT 26 05/17/2018   AST 19 05/17/2018   ALKPHOS 74 03/07/2017   BILITOT 1.0 05/17/2018   Lab Results  Component Value Date   WBC 10.4 05/17/2018   HGB 15.6 05/17/2018   HCT 45.0 05/17/2018   MCV 80.6 05/17/2018   PLT 231 05/17/2018   Lab Results  Component Value Date   CHOL 202 (H) 05/17/2018   HDL 49 05/17/2018    LDLCALC 134 (H) 05/17/2018   TRIG 91 05/17/2018   CHOLHDL 4.1 05/17/2018   The 10-year ASCVD risk score Denman George(Goff DC Jr., et al., 2013) is: 1.3%   Values used to calculate the score:     Age: 89 years     Sex: Male     Is Non-Hispanic African American: No     Diabetic: No     Tobacco smoker:  No     Systolic Blood Pressure: 725 mmHg     Is BP treated: Yes     HDL Cholesterol: 49 mg/dL     Total Cholesterol: 202 mg/dL    No results found for this or any previous visit (from the past 72 hour(s)). No results found.    Assessment and Plan: 43 y.o. male with   .Nayel was seen today for medication management.  Diagnoses and all orders for this visit:  ADHD (attention deficit hyperactivity disorder), inattentive type -     amphetamine-dextroamphetamine (ADDERALL XR) 15 MG 24 hr capsule; Take 1 capsule by mouth every morning. -     amphetamine-dextroamphetamine (ADDERALL) 10 MG tablet; Take 1 tablet (10 mg total) by mouth daily with lunch.  Erectile dysfunction due to diseases classified elsewhere -     sildenafil (REVATIO) 20 MG tablet; TAKE 3 TABS BY MOUTH 30 MINUTES PRIOR TO SEXUAL ACTIVITY     Hypertension Patient unable to provide blood pressure or pulse today Declined prescription for a blood pressure cuff, states he will purchase another one online Counseled him to send in his home blood pressure and heart rate readings via my chart in approximately 2 weeks If he is unable to do this I have counseled him to come into the office in about 2 weeks for nurse visit blood pressure check  ADHD Refills provided as above Needs blood pressure monitoring as above  Follow-up in office in 3 months for fasting labs    Follow Up Instructions:    I discussed the assessment and treatment plan with the patient. The patient was provided an opportunity to ask questions and all were answered. The patient agreed with the plan and demonstrated an understanding of the instructions.   The  patient was advised to call back or seek an in-person evaluation if the symptoms worsen or if the condition fails to improve as anticipated.  I provided 10 minutes of non-face-to-face time during this encounter.   Trixie Dredge, [place]

## 2019-02-27 NOTE — Telephone Encounter (Signed)
Received fax from [employer] that Adderall requires a PA. Information has been sent to the insurance company. Awaiting determination.   

## 2019-02-28 NOTE — Telephone Encounter (Signed)
Received fax from [place] that Adderall 10 and 15 mg  was approved from 02/27/2019 through 02/26/2022 Pharmacy notified and forms sent to scan.

## 2019-05-13 ENCOUNTER — Other Ambulatory Visit: Payer: Self-pay | Admitting: Physician Assistant

## 2019-05-13 DIAGNOSIS — I10 Essential (primary) hypertension: Secondary | ICD-10-CM

## 2019-05-13 DIAGNOSIS — F909 Attention-deficit hyperactivity disorder, unspecified type: Secondary | ICD-10-CM

## 2019-05-14 MED ORDER — AMPHETAMINE-DEXTROAMPHETAMINE 10 MG PO TABS: 10.0000 mg | ORAL_TABLET | Freq: Every day | ORAL | 0 refills | Status: DC

## 2019-05-14 MED ORDER — AMPHETAMINE-DEXTROAMPHET ER 15 MG PO CP24: 15.0000 mg | ORAL_CAPSULE | ORAL | 0 refills | Status: DC

## 2019-05-14 MED ORDER — LISINOPRIL 10 MG PO TABS: 10.0000 mg | ORAL_TABLET | Freq: Every day | ORAL | 0 refills | Status: DC

## 2019-05-14 NOTE — Telephone Encounter (Signed)
Medications refilled but he is due for a follow-up in mid to late October.  Please have him schedule

## 2019-05-14 NOTE — Telephone Encounter (Signed)
Last RX sent 02/27/19  Takes Adderall XR 15 mg and Adderall 10 mg  Last OV 02/27/19

## 2019-06-12 ENCOUNTER — Other Ambulatory Visit: Payer: Self-pay

## 2019-06-12 ENCOUNTER — Ambulatory Visit (INDEPENDENT_AMBULATORY_CARE_PROVIDER_SITE_OTHER): Payer: BC Managed Care – PPO | Admitting: Osteopathic Medicine

## 2019-06-12 VITALS — BP 125/83 | HR 79 | Temp 98.3°F | Wt 317.0 lb

## 2019-06-12 DIAGNOSIS — Z79899 Other long term (current) drug therapy: Secondary | ICD-10-CM | POA: Diagnosis not present

## 2019-06-12 DIAGNOSIS — E785 Hyperlipidemia, unspecified: Secondary | ICD-10-CM | POA: Diagnosis not present

## 2019-06-12 DIAGNOSIS — N521 Erectile dysfunction due to diseases classified elsewhere: Secondary | ICD-10-CM

## 2019-06-12 DIAGNOSIS — R7989 Other specified abnormal findings of blood chemistry: Secondary | ICD-10-CM

## 2019-06-12 DIAGNOSIS — F900 Attention-deficit hyperactivity disorder, predominantly inattentive type: Secondary | ICD-10-CM

## 2019-06-12 DIAGNOSIS — Z8042 Family history of malignant neoplasm of prostate: Secondary | ICD-10-CM | POA: Diagnosis not present

## 2019-06-12 DIAGNOSIS — K449 Diaphragmatic hernia without obstruction or gangrene: Secondary | ICD-10-CM

## 2019-06-12 DIAGNOSIS — I10 Essential (primary) hypertension: Secondary | ICD-10-CM

## 2019-06-12 DIAGNOSIS — F909 Attention-deficit hyperactivity disorder, unspecified type: Secondary | ICD-10-CM

## 2019-06-12 MED ORDER — TESTOSTERONE 20.25 MG/ACT (1.62%) TD GEL: 2.0000 | Freq: Every day | TRANSDERMAL | 2 refills | Status: DC

## 2019-06-12 MED ORDER — SILDENAFIL CITRATE 20 MG PO TABS: ORAL_TABLET | ORAL | 0 refills | Status: DC

## 2019-06-12 MED ORDER — AMPHETAMINE-DEXTROAMPHET ER 15 MG PO CP24: 15.0000 mg | ORAL_CAPSULE | ORAL | 0 refills | Status: DC

## 2019-06-12 NOTE — Progress Notes (Signed)
HPI: Richard Cordova is a 43 y.o. male who  has a past medical history of ADHD (attention deficit hyperactivity disorder), inattentive type (03/07/2014), Fatigue (07/24/2013), Hyperlipidemia (08/09/2013), Hypotestosteronism (07/26/2013), MRSA infection (07/24/2013), Obesity (07/24/2013), Secondary male hypogonadism (03/09/2017), and Suspect osteomyelitis of fifth toe of left foot ([place]) (10/04/2017).  he presents to [facility] today, 06/12/19,  for chief complaint of:  ADHD medications Hernia issue  Patient overall doing well, out of work for a while due to [employer], works in Engineer, mining at a car dealership now in [place].  Reports history of hiatal hernia, had talked before about getting bariatric surgery/gastric band and correcting the hiatal hernia at some point but he has not looked into getting the surgery in the past couple of years, when he was first investigating this it was going to be several thousand dollars out-of-pocket.  Hiatal hernia now, he is noticing a lot more indigestion, feeling difficulty swallowing, feeling like he is getting food stuck, noticing more heartburn.  He is taking some Pepcid AC over-the-counter which is minimally helpful.  reports still using testosterone, though it looks like this has not been prescribed in a while  ADHD is under control on current medications.  He uses the short acting medication as needed maybe a couple of times per week, maybe 3 to 4 days.  He has plenty left over of this.  PDMP reviewed: Last prescription for Adderall ER 15 mg and Adderall IR 10 mg, both #30, written for and filled on 05/14/2019.    At today's visit 06/12/19 ... PMH, PSH, FH reviewed and updated as needed.  Current medication list and allergy/intolerance hx reviewed and updated as needed. (See remainder of HPI, ROS, Phys Exam below)   No results found.  No results found for this or any previous visit (from the past 72  hour(s)).        ASSESSMENT/PLAN: The primary encounter diagnosis was ADHD (attention deficit hyperactivity disorder), inattentive type. Diagnoses of Encounter for long-term (current) use of medications, Borderline hyperlipidemia, Family history of prostate cancer in father, Hypertension goal BP (blood pressure) < 130/80, Low testosterone, Erectile dysfunction due to diseases classified elsewhere, Adult ADHD, and Hiatal hernia were also pertinent to this visit.   Orders Placed This Encounter  Procedures  . CBC  . COMPLETE METABOLIC PANEL WITH GFR  . Lipid panel  . Hemoglobin A1c  . PSA, Total with Reflex to PSA, Free  . Ambulatory referral to Gastroenterology     Meds ordered this encounter  Medications  . Testosterone (ANDROGEL PUMP) 20.25 MG/ACT (1.62%) GEL    Sig: Place 2 Pump onto the skin daily.    Dispense:  75 g    Refill:  2  . sildenafil (REVATIO) 20 MG tablet    Sig: TAKE 3 TABS BY MOUTH 30 MINUTES PRIOR TO SEXUAL ACTIVITY    Dispense:  90 tablet    Refill:  0    **Patient requests 90 days supply**  . amphetamine-dextroamphetamine (ADDERALL XR) 15 MG 24 hr capsule    Sig: Take 1 capsule by mouth every morning.    Dispense:  30 capsule    Refill:  0    Patient is advised to let me know if he wants to pursue bariatric referral again, I advised he check with his insurance as coverage for these procedures/treatments have gotten a lot more affordable over the past several years.  Referral to GI.   Follow-up plan: Return in about 6 months (around 12/11/2019)  for ANNUAL (call week prior to visit for lab orders).                                                 ################################################# ################################################# ################################################# #################################################    Current Meds  Medication Sig  .  amphetamine-dextroamphetamine (ADDERALL XR) 15 MG 24 hr capsule Take 1 capsule by mouth every morning.  Marland Kitchen amphetamine-dextroamphetamine (ADDERALL) 10 MG tablet Take 1 tablet (10 mg total) by mouth daily with lunch.  . cetirizine (ZYRTEC) 10 MG tablet Take 1 tablet (10 mg total) by mouth daily.  . clotrimazole-betamethasone (LOTRISONE) cream Apply 1 application topically 2 (two) times daily.  . cromolyn (NASALCROM) 5.2 MG/ACT nasal spray Place 1 spray into both nostrils 3 (three) times daily. In each nostrl  . lisinopril (ZESTRIL) 10 MG tablet Take 1 tablet (10 mg total) by mouth daily.  . montelukast (SINGULAIR) 10 MG tablet Take 1 tablet (10 mg total) by mouth at bedtime.  . sildenafil (REVATIO) 20 MG tablet TAKE 3 TABS BY MOUTH 30 MINUTES PRIOR TO SEXUAL ACTIVITY  . [DISCONTINUED] amphetamine-dextroamphetamine (ADDERALL XR) 15 MG 24 hr capsule Take 1 capsule by mouth every morning.  . [DISCONTINUED] sildenafil (REVATIO) 20 MG tablet TAKE 3 TABS BY MOUTH 30 MINUTES PRIOR TO SEXUAL ACTIVITY    Allergies  Allergen Reactions  . Coconut Fatty Acids Shortness Of Breath  . Escitalopram Other (See Comments)    Low libido, ED       Review of Systems:  Constitutional: No recent illness  Cardiac: No  chest pain, No  pressure, No palpitations  Respiratory:  No  shortness of breath. No  Cough  Gastrointestinal: No  abdominal pain, no change on bowel habits, +GERD/dysphagia per HPI   Musculoskeletal: No new myalgia/arthralgia  Neurologic: No  weakness, No  Dizziness  Psychiatric: No  concerns with depression, No  concerns with anxiety  Exam:  BP 125/83 (BP Location: Right Arm, Patient Position: Sitting, Cuff Size: Large)   Pulse 79   Temp 98.3 F (36.8 C) (Oral)   Wt (!) 317 lb (143.8 kg)   BMI 42.99 kg/m   Constitutional: VS see above. General Appearance: alert, well-developed, well-nourished, NAD  Neck: No masses, trachea midline.   Respiratory: Normal respiratory effort. no  wheeze, no rhonchi, no rales  Cardiovascular: S1/S2 normal, no murmur, no rub/gallop auscultated. RRR.   Musculoskeletal: Gait normal  Neurological: Normal balance/coordination. No tremor.  Skin: warm, dry, intact.   Psychiatric: Normal judgment/insight. Normal mood and affect. Oriented x3.       Visit summary with medication list and pertinent instructions was printed for patient to review, patient was advised to alert [place] if any updates are needed. All questions at time of visit were answered - patient instructed to contact office with any additional concerns. ER/RTC precautions were reviewed with the patient and understanding verbalized.   Note: Total time spent 25 minutes, greater than 50% of the visit was spent face-to-face counseling and coordinating care for the following: The primary encounter diagnosis was ADHD (attention deficit hyperactivity disorder), inattentive type. Diagnoses of Encounter for long-term (current) use of medications, Borderline hyperlipidemia, Family history of prostate cancer in father, Hypertension goal BP (blood pressure) < 130/80, Low testosterone, Erectile dysfunction due to diseases classified elsewhere, Adult ADHD, and Hiatal hernia were also pertinent to this visit.Marland Kitchen  Please note: voice recognition  software was used to produce this document, and typos may escape review. Please contact Dr. Lyn HollingsheadAlexander for any needed clarifications.    Follow up plan: Return in about 6 months (around 12/11/2019) [place] (call week prior to visit for lab orders).

## 2019-06-13 LAB — COMPLETE METABOLIC PANEL WITH GFR
AG Ratio: 1.6 (calc) (ref 1.0–2.5)
ALT: 20 U/L (ref 9–46)
AST: 15 U/L (ref 10–40)
Albumin: 4.5 g/dL (ref 3.6–5.1)
Alkaline phosphatase (APISO): 77 U/L (ref 36–130)
BUN: 14 mg/dL (ref 7–25)
CO2: 24 mmol/L (ref 20–32)
Calcium: 9.7 mg/dL (ref 8.6–10.3)
Chloride: 105 mmol/L (ref 98–110)
Creat: 0.9 mg/dL (ref 0.60–1.35)
GFR, Est African American: 122 mL/min/{1.73_m2} (ref >=60)
GFR, Est Non African American: 105 mL/min/{1.73_m2} (ref >=60)
Globulin: 2.8 g/dL (calc) (ref 1.9–3.7)
Glucose, Bld: 94 mg/dL (ref 65–99)
Potassium: 4.6 mmol/L (ref 3.5–5.3)
Sodium: 140 mmol/L (ref 135–146)
Total Bilirubin: 0.9 mg/dL (ref 0.2–1.2)
Total Protein: 7.3 g/dL (ref 6.1–8.1)

## 2019-06-13 LAB — CBC
HCT: 45.1 % (ref 38.5–50.0)
Hemoglobin: 15.3 g/dL (ref 13.2–17.1)
MCH: 28.2 pg (ref 27.0–33.0)
MCHC: 33.9 g/dL (ref 32.0–36.0)
MCV: 83.2 fL (ref 80.0–100.0)
MPV: 11.3 fL (ref 7.5–12.5)
Platelets: 260 10*3/uL (ref 140–400)
RBC: 5.42 10*6/uL (ref 4.20–5.80)
RDW: 13.1 % (ref 11.0–15.0)
WBC: 9.4 10*3/uL (ref 3.8–10.8)

## 2019-06-13 LAB — LIPID PANEL
Cholesterol: 203 mg/dL — ABNORMAL HIGH (ref <200)
HDL: 50 mg/dL (ref >=40)
LDL Cholesterol (Calc): 130 mg/dL (calc) — ABNORMAL HIGH
Non-HDL Cholesterol (Calc): 153 mg/dL (calc) — ABNORMAL HIGH (ref <130)
Total CHOL/HDL Ratio: 4.1 (calc) (ref <5.0)
Triglycerides: 122 mg/dL (ref <150)

## 2019-06-13 LAB — PSA, TOTAL WITH REFLEX TO PSA, FREE: PSA, Total: 0.9 ng/mL (ref <=4.0)

## 2019-06-13 LAB — HEMOGLOBIN A1C
Hgb A1c MFr Bld: 4.6 % of total Hgb (ref <5.7)
Mean Plasma Glucose: 85 (calc)
eAG (mmol/L): 4.7 (calc)

## 2019-06-14 ENCOUNTER — Encounter: Payer: Self-pay | Admitting: Osteopathic Medicine

## 2019-06-14 ENCOUNTER — Telehealth: Payer: Self-pay | Admitting: Osteopathic Medicine

## 2019-06-14 NOTE — Telephone Encounter (Signed)
Received fax for prior authorization on Testosterone sent through cover my meds waiting on determination. - CF

## 2019-06-15 MED ORDER — SILDENAFIL CITRATE 100 MG PO TABS: 50.0000 mg | ORAL_TABLET | Freq: Every day | ORAL | 11 refills | Status: DC | PRN

## 2019-06-15 NOTE — Addendum Note (Signed)
Addended by: Maryla Morrow on: 06/15/2019 04:31 PM   Modules accepted: Orders

## 2019-06-18 NOTE — Telephone Encounter (Signed)
Received authorization from [place] for Testosterone. I will notify pharmacy. - CF  Valid: 06/14/2019 - 06/13/2022  PA# [id number] Non-Grandfathered [id number] VS

## 2019-07-24 ENCOUNTER — Other Ambulatory Visit: Payer: Self-pay | Admitting: Family Medicine

## 2019-07-24 ENCOUNTER — Other Ambulatory Visit: Payer: Self-pay | Admitting: Osteopathic Medicine

## 2019-07-24 DIAGNOSIS — F909 Attention-deficit hyperactivity disorder, unspecified type: Secondary | ICD-10-CM

## 2019-07-24 MED ORDER — AMPHETAMINE-DEXTROAMPHET ER 15 MG PO CP24: 15.0000 mg | ORAL_CAPSULE | ORAL | 0 refills | Status: DC

## 2019-07-24 MED ORDER — AMPHETAMINE-DEXTROAMPHETAMINE 10 MG PO TABS: 10.0000 mg | ORAL_TABLET | Freq: Every day | ORAL | 0 refills | Status: DC

## 2019-08-05 ENCOUNTER — Other Ambulatory Visit: Payer: Self-pay | Admitting: Family Medicine

## 2019-08-05 DIAGNOSIS — I10 Essential (primary) hypertension: Secondary | ICD-10-CM

## 2019-08-09 ENCOUNTER — Encounter: Payer: Self-pay | Admitting: Internal Medicine

## 2019-08-23 ENCOUNTER — Other Ambulatory Visit: Payer: Self-pay | Admitting: Physician Assistant

## 2019-08-23 DIAGNOSIS — J309 Allergic rhinitis, unspecified: Secondary | ICD-10-CM

## 2019-09-05 ENCOUNTER — Other Ambulatory Visit: Payer: Self-pay | Admitting: Osteopathic Medicine

## 2019-09-05 DIAGNOSIS — F909 Attention-deficit hyperactivity disorder, unspecified type: Secondary | ICD-10-CM

## 2019-09-06 MED ORDER — AMPHETAMINE-DEXTROAMPHETAMINE 10 MG PO TABS: 10.0000 mg | ORAL_TABLET | Freq: Every day | ORAL | 0 refills | Status: DC

## 2019-09-06 MED ORDER — AMPHETAMINE-DEXTROAMPHET ER 15 MG PO CP24: 15.0000 mg | ORAL_CAPSULE | ORAL | 0 refills | Status: DC

## 2019-10-18 ENCOUNTER — Other Ambulatory Visit: Payer: Self-pay | Admitting: Osteopathic Medicine

## 2019-10-18 DIAGNOSIS — F909 Attention-deficit hyperactivity disorder, unspecified type: Secondary | ICD-10-CM

## 2019-10-19 MED ORDER — AMPHETAMINE-DEXTROAMPHET ER 15 MG PO CP24: 15.0000 mg | ORAL_CAPSULE | ORAL | 0 refills | Status: DC

## 2019-10-19 MED ORDER — AMPHETAMINE-DEXTROAMPHETAMINE 10 MG PO TABS: 10.0000 mg | ORAL_TABLET | Freq: Every day | ORAL | 0 refills | Status: DC

## 2019-10-19 NOTE — Telephone Encounter (Signed)
[facility] requesting [facility] amp[facility]-dextro.

## 2019-11-27 ENCOUNTER — Other Ambulatory Visit: Payer: Self-pay | Admitting: Osteopathic Medicine

## 2019-11-27 ENCOUNTER — Other Ambulatory Visit: Payer: Self-pay | Admitting: Family Medicine

## 2019-11-27 ENCOUNTER — Other Ambulatory Visit: Payer: Self-pay | Admitting: Physician Assistant

## 2019-11-27 DIAGNOSIS — I10 Essential (primary) hypertension: Secondary | ICD-10-CM

## 2019-11-27 DIAGNOSIS — F909 Attention-deficit hyperactivity disorder, unspecified type: Secondary | ICD-10-CM

## 2019-11-27 DIAGNOSIS — J309 Allergic rhinitis, unspecified: Secondary | ICD-10-CM

## 2019-11-29 MED ORDER — MONTELUKAST SODIUM 10 MG PO TABS: 10.0000 mg | ORAL_TABLET | Freq: Every day | ORAL | 0 refills | Status: DC

## 2019-11-29 MED ORDER — LISINOPRIL 10 MG PO TABS: 10.0000 mg | ORAL_TABLET | Freq: Every day | ORAL | 0 refills | Status: DC

## 2019-11-29 NOTE — Telephone Encounter (Signed)
[facility] amphetamine-dextro 10 mg and 15 mg.

## 2019-11-30 MED ORDER — AMPHETAMINE-DEXTROAMPHET ER 15 MG PO CP24: 15.0000 mg | ORAL_CAPSULE | ORAL | 0 refills | Status: DC

## 2019-11-30 MED ORDER — AMPHETAMINE-DEXTROAMPHETAMINE 10 MG PO TABS: 10.0000 mg | ORAL_TABLET | Freq: Every day | ORAL | 0 refills | Status: DC

## 2019-12-11 ENCOUNTER — Ambulatory Visit: Payer: BC Managed Care – PPO | Admitting: Osteopathic Medicine

## 2019-12-11 ENCOUNTER — Encounter: Payer: Self-pay | Admitting: Osteopathic Medicine

## 2019-12-11 ENCOUNTER — Ambulatory Visit (INDEPENDENT_AMBULATORY_CARE_PROVIDER_SITE_OTHER): Payer: BC Managed Care – PPO | Admitting: Osteopathic Medicine

## 2019-12-11 VITALS — BP 129/85 | HR 70 | Temp 98.3°F | Wt 315.0 lb

## 2019-12-11 DIAGNOSIS — Z0000 Encounter for general adult medical examination without abnormal findings: Secondary | ICD-10-CM

## 2019-12-11 DIAGNOSIS — E785 Hyperlipidemia, unspecified: Secondary | ICD-10-CM | POA: Diagnosis not present

## 2019-12-11 DIAGNOSIS — F909 Attention-deficit hyperactivity disorder, unspecified type: Secondary | ICD-10-CM | POA: Diagnosis not present

## 2019-12-11 DIAGNOSIS — R7989 Other specified abnormal findings of blood chemistry: Secondary | ICD-10-CM

## 2019-12-11 DIAGNOSIS — I10 Essential (primary) hypertension: Secondary | ICD-10-CM

## 2019-12-11 DIAGNOSIS — B351 Tinea unguium: Secondary | ICD-10-CM

## 2019-12-11 DIAGNOSIS — J309 Allergic rhinitis, unspecified: Secondary | ICD-10-CM

## 2019-12-11 LAB — COMPLETE METABOLIC PANEL WITH GFR
AG Ratio: 1.7 (calc) (ref 1.0–2.5)
ALT: 26 U/L (ref 9–46)
AST: 17 U/L (ref 10–40)
Albumin: 4.5 g/dL (ref 3.6–5.1)
Alkaline phosphatase (APISO): 77 U/L (ref 36–130)
BUN: 13 mg/dL (ref 7–25)
CO2: 30 mmol/L (ref 20–32)
Calcium: 9.6 mg/dL (ref 8.6–10.3)
Chloride: 104 mmol/L (ref 98–110)
Creat: 0.86 mg/dL (ref 0.60–1.35)
GFR, Est African American: 124 mL/min/{1.73_m2} (ref >=60)
GFR, Est Non African American: 107 mL/min/{1.73_m2} (ref >=60)
Globulin: 2.7 g/dL (calc) (ref 1.9–3.7)
Glucose, Bld: 97 mg/dL (ref 65–99)
Potassium: 4.8 mmol/L (ref 3.5–5.3)
Sodium: 139 mmol/L (ref 135–146)
Total Bilirubin: 0.7 mg/dL (ref 0.2–1.2)
Total Protein: 7.2 g/dL (ref 6.1–8.1)

## 2019-12-11 MED ORDER — MONTELUKAST SODIUM 10 MG PO TABS: 10.0000 mg | ORAL_TABLET | Freq: Every day | ORAL | 0 refills | Status: DC

## 2019-12-11 MED ORDER — TESTOSTERONE 20.25 MG/ACT (1.62%) TD GEL: 2.0000 | Freq: Every day | TRANSDERMAL | 2 refills | Status: DC

## 2019-12-11 MED ORDER — TERBINAFINE HCL 250 MG PO TABS
250.0000 mg | ORAL_TABLET | Freq: Every day | ORAL | 0 refills | Status: AC
Start: 2019-12-11 — End: 2020-03-04

## 2019-12-11 MED ORDER — LISINOPRIL 10 MG PO TABS: 10.0000 mg | ORAL_TABLET | Freq: Every day | ORAL | 0 refills | Status: DC

## 2019-12-11 MED ORDER — SILDENAFIL CITRATE 100 MG PO TABS: 50.0000 mg | ORAL_TABLET | Freq: Every day | ORAL | 11 refills | Status: DC | PRN

## 2019-12-11 NOTE — Patient Instructions (Signed)
General Preventive Care  Most recent routine screening labs: from 05/2019 looked good!  Blood pressure: Goal 130/80 or less.   Tobacco: don't!   Alcohol: responsible moderation is ok for most adults - if you have concerns about your alcohol intake, please talk to me!   Exercise: as tolerated to reduce risk of cardiovascular disease and diabetes. Strength training will also prevent osteoporosis.   Mental health: if need for mental health care (medicines, counseling, other), or concerns about moods, please let me know!   Sexual health: if need for STD testing, or if concerns with libido/pain problems, please let me know!  Advanced Directive: Living Will and/or Healthcare Power of Attorney recommended for all adults, regardless of age or health.  Vaccines  Flu vaccine: for almost everyone, every fall.   Shingles vaccine: after age 49.   Pneumonia vaccines: after age 44  Tetanus booster: every 10 years - due 2023  COVID vaccine: THANKS for getting your vaccine!  Cancer screenings   Colon cancer screening: for everyone age 81-75. Colonoscopy available for all, many people also qualify for the Cologuard stool test   Prostate cancer screening: PSA blood test age 15-71  Lung cancer screening:  Infection screenings  . HIV: recommended screening at least once age 54-65, more often as needed. . Gonorrhea/Chlamydia: screening as needed . Hepatitis C: recommended once for everyone age 84-75 . TB: certain at-risk populations, or depending on work requirements and/or travel history Other . Bone Density Test: recommended for men at age 65 . Abdominal Aortic Aneurysm: screening with ultrasound recommended once for men age 86-75 who have ever smoked

## 2019-12-11 NOTE — Progress Notes (Signed)
Richard Cordova is a 44 y.o. male who presents to  [facility]  today, 12/11/19, seeking care for the following: . ANNUAL PHYSICAL  . (+)PHQ DEPRESSION SCREENING      ASSESSMENT & PLAN with other pertinent history/findings:  The primary encounter diagnosis was Annual physical exam. Diagnoses of Borderline hyperlipidemia, Hypertension goal BP (blood pressure) < 130/80, Adult ADHD, Low testosterone, Allergic rhinitis, unspecified seasonality, unspecified trigger, and Onychomycosis were also pertinent to this visit.  Preventive care reviewed as below  Positive depression screening, patient notes some relationship issues with his wife, who is having a hard time lately.  Attributes positive scores to this.  Would like to be connected with therapist to discuss things further.  Persistent onychomycosis, patient would like to discuss removal of affected parts of toenail, we will get him scheduled.  Orders Placed This Encounter  Procedures  . COMPLETE METABOLIC PANEL WITH GFR    Meds ordered this encounter  Medications  . lisinopril (ZESTRIL) 10 MG tablet    Sig: Take 1 tablet (10 mg total) by mouth daily.    Dispense:  90 tablet    Refill:  0  . montelukast (SINGULAIR) 10 MG tablet    Sig: Take 1 tablet (10 mg total) by mouth daily.    Dispense:  90 tablet    Refill:  0  . sildenafil (VIAGRA) 100 MG tablet    Sig: Take 0.5-1 tablets (50-100 mg total) by mouth daily as needed for erectile dysfunction (please run goodrx coupon).    Dispense:  30 tablet    Refill:  11  . Testosterone (ANDROGEL PUMP) 20.25 MG/ACT (1.62%) GEL    Sig: Place 2 Pump onto the skin daily.    Dispense:  75 g    Refill:  2  . terbinafine (LAMISIL) 250 MG tablet    Sig: Take 1 tablet (250 mg total) by mouth daily.    Dispense:  90 tablet    Refill:  0    General Preventive Care  Most recent routine screening labs: from 05/2019 looked good!  Blood  pressure: Goal 130/80 or less.   Tobacco: don't!   Alcohol: responsible moderation is ok for most adults - if you have concerns about your alcohol intake, please talk to me!   Exercise: as tolerated to reduce risk of cardiovascular disease and diabetes. Strength training will also prevent osteoporosis.   Mental health: if need for mental health care (medicines, counseling, other), or concerns about moods, please let me know!   Sexual health: if need for STD testing, or if concerns with libido/pain problems, please let me know!  Advanced Directive: Living Will and/or Healthcare Power of Attorney recommended for all adults, regardless of age or health.  Vaccines  Flu vaccine: for almost everyone, every fall.   Shingles vaccine: after age 99.   Pneumonia vaccines: after age 42  Tetanus booster: every 10 years - due 2023  COVID vaccine: THANKS for getting your vaccine!  Cancer screenings   Colon cancer screening: for everyone age 17-75. Colonoscopy available for all, many people also qualify for the Cologuard stool test   Prostate cancer screening: PSA blood test age 17-71  Lung cancer screening:  Infection screenings  . HIV: recommended screening at least once age 41-65, more often as needed. . Gonorrhea/Chlamydia: screening as needed . Hepatitis C: recommended once for everyone age 58-75 . TB: certain at-risk populations, or depending on work requirements and/or travel history Other .  Bone Density Test: recommended for men at age 28 . Abdominal Aortic Aneurysm: screening with ultrasound recommended once for men age 44-75 who have ever smoked    Constitutional:  . VSS, BP 129/85 (BP Location: Left Arm, Patient Position: Sitting, Cuff Size: Large)   Pulse 70   Temp 98.3 F (36.8 C) (Oral)   Wt (!) 315 lb 0.6 oz (142.9 kg)   BMI 42.73 kg/m  . General Appearance: alert, well-developed, well-nourished, NAD Eyes: Marland Kitchen Normal lids and conjunctive, non-icteric  sclera . PERRLA Ears, Nose, Mouth, Throat: . Normal appearance, mask in place over nose/mouth Neck: . No masses, trachea midline . No thyroid enlargement/tenderness/mass appreciated Respiratory: . Normal respiratory effort . No dullness/hyper-resonance to percussion . Breath sounds normal, no wheeze/rhonchi/rales Cardiovascular: . S1/S2 normal, no murmur/rub/gallop auscultated . No lower extremity edema Gastrointestinal: . Nontender, no masses . No hepatomegaly, no splenomegaly . No hernia appreciated Musculoskeletal:  . Gait normal . No clubbing/cyanosis of digits Neurological: . No cranial nerve deficit on limited exam . Motor and sensation intact and symmetric Psychiatric: . Normal judgment/insight . Normal mood and affect      Follow-up instructions: Return in about 16 days (around 12/27/2019) for arrive 7:15 for toenail removal procedure . - CBC, CMP, LIPID, PSA                                         BP 129/85 (BP Location: Left Arm, Patient Position: Sitting, Cuff Size: Large)   Pulse 70   Temp 98.3 F (36.8 C) (Oral)   Wt (!) 315 lb 0.6 oz (142.9 kg)   BMI 42.73 kg/m   Current Meds  Medication Sig  . amphetamine-dextroamphetamine (ADDERALL XR) 15 MG 24 hr capsule Take 1 capsule by mouth every morning.  Marland Kitchen amphetamine-dextroamphetamine (ADDERALL) 10 MG tablet Take 1 tablet (10 mg total) by mouth daily with lunch.  . cetirizine (ZYRTEC) 10 MG tablet Take 1 tablet (10 mg total) by mouth daily.  . clotrimazole-betamethasone (LOTRISONE) cream Apply 1 application topically 2 (two) times daily.  . cromolyn (NASALCROM) 5.2 MG/ACT nasal spray Place 1 spray into both nostrils 3 (three) times daily. In each nostrl  . lisinopril (ZESTRIL) 10 MG tablet Take 1 tablet (10 mg total) by mouth daily.  . montelukast (SINGULAIR) 10 MG tablet Take 1 tablet (10 mg total) by mouth daily.  . sildenafil (VIAGRA) 100 MG tablet Take 0.5-1 tablets  (50-100 mg total) by mouth daily as needed for erectile dysfunction (please run goodrx coupon).  . Testosterone (ANDROGEL PUMP) 20.25 MG/ACT (1.62%) GEL Place 2 Pump onto the skin daily.  . [DISCONTINUED] lisinopril (ZESTRIL) 10 MG tablet Take 1 tablet (10 mg total) by mouth daily.  . [DISCONTINUED] montelukast (SINGULAIR) 10 MG tablet Take 1 tablet (10 mg total) by mouth daily.  . [DISCONTINUED] sildenafil (REVATIO) 20 MG tablet TAKE 3 TABS BY MOUTH 30 MINUTES PRIOR TO SEXUAL ACTIVITY  . [DISCONTINUED] sildenafil (VIAGRA) 100 MG tablet Take 0.5-1 tablets (50-100 mg total) by mouth daily as needed for erectile dysfunction (please run goodrx coupon).  . [DISCONTINUED] Testosterone (ANDROGEL PUMP) 20.25 MG/ACT (1.62%) GEL Place 2 Pump onto the skin daily.    Results for orders placed or performed in visit on 12/11/19 (from the past 72 hour(s))  COMPLETE METABOLIC PANEL WITH GFR     Status: None   Collection Time: 12/11/19 10:25 AM  Result Value Ref  Range   Glucose, Bld 97 65 - 99 mg/dL    Comment: .            Fasting reference interval .    BUN 13 7 - 25 mg/dL   Creat 3.74 8.27 - 0.78 mg/dL   GFR, Est Non African American 107 > OR = 60 mL/min/1.43m2   GFR, Est African American 124 > OR = 60 mL/min/1.60m2   BUN/Creatinine Ratio NOT APPLICABLE 6 - 22 (calc)   Sodium 139 135 - 146 mmol/L   Potassium 4.8 3.5 - 5.3 mmol/L   Chloride 104 98 - 110 mmol/L   CO2 30 20 - 32 mmol/L   Calcium 9.6 8.6 - 10.3 mg/dL   Total Protein 7.2 6.1 - 8.1 g/dL   Albumin 4.5 3.6 - 5.1 g/dL   Globulin 2.7 1.9 - 3.7 g/dL (calc)   AG Ratio 1.7 1.0 - 2.5 (calc)   Total Bilirubin 0.7 0.2 - 1.2 mg/dL   Alkaline phosphatase (APISO) 77 36 - 130 U/L   AST 17 10 - 40 U/L   ALT 26 9 - 46 U/L    No results found.  Depression screen [facility] 2/9 12/11/2019 06/12/2019 02/27/2019  Decreased Interest 3 0 0  Down, Depressed, Hopeless 3 0 0  PHQ - 2 Score 6 0 0  Altered sleeping 1 - 0  Tired, decreased energy 3 - 0   Change in appetite 3 - 0  Feeling bad or failure about yourself  3 - 0  Trouble concentrating 3 - 0  Moving slowly or fidgety/restless 3 - 0  Suicidal thoughts 0 - 0  PHQ-9 Score 22 - 0    GAD 7 : Generalized Anxiety Score 12/11/2019 06/12/2019 02/27/2019 04/04/2017  Nervous, Anxious, on Edge 3 0 0 3  Control/stop worrying 1 0 0 3  Worry too much - different things 1 0 0 2  Trouble relaxing 1 0 0 3  Restless 0 0 0 0  Easily annoyed or irritable 3 0 0 3  Afraid - awful might happen 3 0 0 3  Total GAD 7 Score 12 0 0 17      All questions at time of visit were answered - patient instructed to contact office with any additional concerns or updates.  ER/RTC precautions were reviewed with the patient.  Please note: voice recognition software was used to produce this document, and typos may escape review. Please contact Dr. Lyn Hollingshead for any needed clarifications.   Total encounter time: 30 minutes.

## 2019-12-27 ENCOUNTER — Encounter: Payer: Self-pay | Admitting: Osteopathic Medicine

## 2019-12-27 ENCOUNTER — Ambulatory Visit: Payer: BC Managed Care – PPO | Admitting: Osteopathic Medicine

## 2019-12-27 ENCOUNTER — Other Ambulatory Visit: Payer: Self-pay

## 2019-12-27 VITALS — BP 116/77 | HR 66 | Temp 98.0°F | Wt 317.1 lb

## 2019-12-27 DIAGNOSIS — B351 Tinea unguium: Secondary | ICD-10-CM

## 2019-12-27 MED ORDER — CICLOPIROX 8 % EX SOLN
Freq: Every day | CUTANEOUS | 2 refills | Status: DC
Start: 2019-12-27 — End: 2021-09-25

## 2019-12-27 NOTE — Patient Instructions (Signed)
Fingernail or Toenail Removal, Adult, Care After This sheet gives you information about how to care for yourself after your procedure. Your health care provider may also give you more specific instructions. If you have problems or questions, contact your health care provider. What can I expect after the procedure? After the procedure, it is common to have:  Pain.  Redness.  Swelling.  Soreness. Follow these instructions at home: Medicines  Take over-the-counter and prescription medicines only as told by your health care provider.  If you were prescribed an antibiotic medicine, take or apply it as told by your health care provider. Do not stop using the antibiotic even if you start to feel better. NEOSPORIN FOR FIRST FEW DAYS Wound care  Follow instructions from your health care provider about how to take care of your wound. Make sure you: ? Wash your hands with soap and water for at least 20 seconds before and after you change your bandage (dressing). If soap and water are not available, use hand sanitizer. ? Change your dressing as told by your health care provider. ? Keep your dressing dry until your health care provider says it can be removed.  Check your wound every day for signs of infection. Check for: ? More redness, swelling, or pain. ? More fluid or blood. ? Warmth. ? Pus or a bad smell. If you have a splint:   Wear the splint as told by your health care provider. Remove it only as told by your health care provider.  Loosen the splint if your fingers tingle, become numb, or turn cold and blue.  Keep the splint clean.  If the splint is not waterproof: ? Do not let it get wet. ? Cover it with a watertight covering when you take a bath or a shower. Managing pain, stiffness, and swelling  Move your fingers or toes often to reduce stiffness and swelling.  Raise (elevate) the injured area above the level of your heart while you are sitting or lying down. You may need  to keep your hand or foot raised or supported on a pillow for 24 hours or as told by your health care provider. General instructions  If you were given a shoe to wear, wear it as told by your health care provider.  Keep all follow-up visits as told by your health care provider. This is important. Contact a health care provider if:  You have more redness, swelling, or pain around your wound.  You have more fluid or blood coming from your wound.  You have pus or a bad smell coming from your wound.  Your wound feels warm to the touch.  You have a fever. Get help right away if:  Your finger or toe looks pale, blue, or black.  You are not able to move your finger or toe. Summary  After the procedure, it is common to have pain and swelling.  Keep the hand or foot raised (elevated) or supported on a pillow as told by your health care provider.  Take over-the-counter and prescription medicines only as told by your health care provider.  Check your wound every day for signs of infection. This information is not intended to replace advice given to you by your health care provider. Make sure you discuss any questions you have with your health care provider. Document Revised: 03/26/2019 Document Reviewed: 03/26/2019 Elsevier Patient Education  2020 [employer].

## 2019-12-27 NOTE — Progress Notes (Signed)
Procedure:  Toenail removal R great toe Dx Severe onychomycosis resistant to treatment  Performed by Sunnie Nielsen DO  Toe cleaned w/ chlorhexidine, draped. Anesthesia obtained w/ Bupivacaine WITHOUT epinephrine. Nail removed to base on lateral and medial aspect and trimmed down removing all infected nail. Bandaged. Pt tolerated procedure well. Routine post-procedure care reviewed and all questions answered.

## 2020-01-09 ENCOUNTER — Other Ambulatory Visit: Payer: Self-pay | Admitting: Osteopathic Medicine

## 2020-01-09 DIAGNOSIS — F909 Attention-deficit hyperactivity disorder, unspecified type: Secondary | ICD-10-CM

## 2020-01-10 NOTE — Telephone Encounter (Signed)
[facility] adderall 10 mg & 15 mg.

## 2020-01-11 MED ORDER — AMPHETAMINE-DEXTROAMPHETAMINE 10 MG PO TABS: 10.0000 mg | ORAL_TABLET | Freq: Every day | ORAL | 0 refills | Status: DC

## 2020-01-11 MED ORDER — AMPHETAMINE-DEXTROAMPHET ER 15 MG PO CP24: 15.0000 mg | ORAL_CAPSULE | ORAL | 0 refills | Status: DC

## 2020-02-11 ENCOUNTER — Other Ambulatory Visit: Payer: Self-pay | Admitting: Osteopathic Medicine

## 2020-02-11 ENCOUNTER — Encounter: Payer: Self-pay | Admitting: Osteopathic Medicine

## 2020-02-11 DIAGNOSIS — F909 Attention-deficit hyperactivity disorder, unspecified type: Secondary | ICD-10-CM

## 2020-02-11 MED ORDER — AMPHETAMINE-DEXTROAMPHETAMINE 10 MG PO TABS: 10.0000 mg | ORAL_TABLET | Freq: Every day | ORAL | 0 refills | Status: DC

## 2020-02-11 MED ORDER — AMPHETAMINE-DEXTROAMPHET ER 15 MG PO CP24: 15.0000 mg | ORAL_CAPSULE | ORAL | 0 refills | Status: DC

## 2020-02-11 NOTE — Telephone Encounter (Signed)
Routing to covering provider. Last written on 01/11/20.

## 2020-02-11 NOTE — Telephone Encounter (Signed)
Patient requesting valacyclovir for cold sore that is not on his medication list. Please advise if he will need to be seen. Last OV was 12/11/2019 for  annual physical.

## 2020-02-12 MED ORDER — ACYCLOVIR 400 MG PO TABS: 400.0000 mg | ORAL_TABLET | Freq: Three times a day (TID) | ORAL | 1 refills | Status: AC

## 2020-04-07 ENCOUNTER — Other Ambulatory Visit: Payer: Self-pay | Admitting: Medical-Surgical

## 2020-04-07 DIAGNOSIS — F909 Attention-deficit hyperactivity disorder, unspecified type: Secondary | ICD-10-CM

## 2020-04-07 MED ORDER — AMPHETAMINE-DEXTROAMPHET ER 15 MG PO CP24: 15.0000 mg | ORAL_CAPSULE | ORAL | 0 refills | Status: DC

## 2020-04-07 MED ORDER — AMPHETAMINE-DEXTROAMPHETAMINE 10 MG PO TABS: 10.0000 mg | ORAL_TABLET | Freq: Every day | ORAL | 0 refills | Status: DC

## 2020-07-14 ENCOUNTER — Other Ambulatory Visit: Payer: Self-pay | Admitting: Osteopathic Medicine

## 2020-07-14 DIAGNOSIS — F909 Attention-deficit hyperactivity disorder, unspecified type: Secondary | ICD-10-CM

## 2020-07-17 ENCOUNTER — Ambulatory Visit: Payer: Self-pay | Admitting: Podiatry

## 2020-07-17 ENCOUNTER — Encounter: Payer: Self-pay | Admitting: Podiatry

## 2020-07-17 ENCOUNTER — Encounter: Payer: Self-pay | Admitting: Osteopathic Medicine

## 2020-07-17 ENCOUNTER — Other Ambulatory Visit: Payer: Self-pay

## 2020-07-17 ENCOUNTER — Ambulatory Visit (INDEPENDENT_AMBULATORY_CARE_PROVIDER_SITE_OTHER): Payer: BC Managed Care – PPO | Admitting: Osteopathic Medicine

## 2020-07-17 VITALS — BP 126/81 | HR 72 | Temp 97.8°F | Wt 331.1 lb

## 2020-07-17 DIAGNOSIS — R7989 Other specified abnormal findings of blood chemistry: Secondary | ICD-10-CM

## 2020-07-17 DIAGNOSIS — Z0000 Encounter for general adult medical examination without abnormal findings: Secondary | ICD-10-CM

## 2020-07-17 DIAGNOSIS — Z23 Encounter for immunization: Secondary | ICD-10-CM | POA: Diagnosis not present

## 2020-07-17 DIAGNOSIS — Z8042 Family history of malignant neoplasm of prostate: Secondary | ICD-10-CM

## 2020-07-17 DIAGNOSIS — K649 Unspecified hemorrhoids: Secondary | ICD-10-CM

## 2020-07-17 DIAGNOSIS — L600 Ingrowing nail: Secondary | ICD-10-CM

## 2020-07-17 DIAGNOSIS — F909 Attention-deficit hyperactivity disorder, unspecified type: Secondary | ICD-10-CM

## 2020-07-17 DIAGNOSIS — J302 Other seasonal allergic rhinitis: Secondary | ICD-10-CM

## 2020-07-17 DIAGNOSIS — J45990 Exercise induced bronchospasm: Secondary | ICD-10-CM

## 2020-07-17 DIAGNOSIS — J309 Allergic rhinitis, unspecified: Secondary | ICD-10-CM

## 2020-07-17 DIAGNOSIS — I10 Essential (primary) hypertension: Secondary | ICD-10-CM | POA: Diagnosis not present

## 2020-07-17 DIAGNOSIS — N529 Male erectile dysfunction, unspecified: Secondary | ICD-10-CM

## 2020-07-17 MED ORDER — TESTOSTERONE 20.25 MG/ACT (1.62%) TD GEL: 2.0000 | Freq: Every day | TRANSDERMAL | 2 refills | Status: DC

## 2020-07-17 MED ORDER — SILDENAFIL CITRATE 100 MG PO TABS: 50.0000 mg | ORAL_TABLET | Freq: Every day | ORAL | 11 refills | Status: DC | PRN

## 2020-07-17 MED ORDER — CETIRIZINE HCL 10 MG PO TABS: 10.0000 mg | ORAL_TABLET | Freq: Every day | ORAL | 3 refills | Status: DC

## 2020-07-17 MED ORDER — MONTELUKAST SODIUM 10 MG PO TABS: 10.0000 mg | ORAL_TABLET | Freq: Every day | ORAL | 3 refills | Status: DC

## 2020-07-17 MED ORDER — HYDROCORTISONE ACETATE 25 MG RE SUPP: 25.0000 mg | Freq: Two times a day (BID) | RECTAL | 2 refills | Status: DC | PRN

## 2020-07-17 MED ORDER — LISINOPRIL 10 MG PO TABS: 10.0000 mg | ORAL_TABLET | Freq: Every day | ORAL | 3 refills | Status: DC

## 2020-07-17 MED ORDER — LIDOCAINE-PRILOCAINE 2.5-2.5 % EX CREA: 1.0000 "application " | TOPICAL_CREAM | CUTANEOUS | 0 refills | Status: DC | PRN

## 2020-07-17 MED ORDER — AMPHETAMINE-DEXTROAMPHET ER 15 MG PO CP24: 15.0000 mg | ORAL_CAPSULE | ORAL | 0 refills | Status: DC

## 2020-07-17 MED ORDER — AMPHETAMINE-DEXTROAMPHETAMINE 10 MG PO TABS: 10.0000 mg | ORAL_TABLET | Freq: Every day | ORAL | 0 refills | Status: DC

## 2020-07-17 NOTE — Telephone Encounter (Signed)
Task completed. Pt had an appointment with provider earlier today.

## 2020-07-17 NOTE — Patient Instructions (Signed)

## 2020-07-17 NOTE — Progress Notes (Signed)
Richard Cordova is a 44 y.o. male who presents to  [facility] at [facility]  today, 07/17/20, seeking care for the following:  . ADHD - last Rx for Adderall XR 15 mg and Adderall IR 10 mg were #30 each on 04/07/2020. PDMP reviewed and c/w this.  Marland Kitchen HTN - last Rx lisinopril was 11/2019 for 90 tabs . Testosterone deficiency - has been some time since labs, last Rx was 11/2019. Initial low T 03/2012 at 285, 07/2013 at 111 and 220.  Marland Kitchen Allergy/Asthma - last refill montelukast was #90 in 11/2019 . Ingrown toenail - previously we removed this, it's grown back out ok but ingrown / skin overgrown again  . Hemorrhoid - bothering him a couple weeks, mild but not getting better.      ASSESSMENT & PLAN with other pertinent findings:  The primary encounter diagnosis was Adult ADHD. Diagnoses of Hypertension goal BP (blood pressure) < 130/80, Hemorrhoids, unspecified hemorrhoid type, Ingrown toenail, Low testosterone, Family history of prostate cancer in father, Allergic rhinitis, unspecified seasonality, unspecified trigger, Exercise induced bronchospasm, Erectile dysfunction, unspecified erectile dysfunction type, Need for influenza vaccination, Seasonal allergies, and Annual physical exam were also pertinent to this visit.   1. Adult ADHD Refilled Rx   2. Hypertension goal BP (blood pressure) < 130/80 Refilled Rx  BP not too bad today but caution given ADHD Rx, T  3. Hemorrhoids, unspecified hemorrhoid type Anusol sent, RTC prn  4. Ingrown toenail Referral to podiatry Bandaged today w/ lidocaine cream   5. Low testosterone Refill T  6. Family history of prostate cancer in father PSA pending   7. Allergic rhinitis, unspecified seasonality, unspecified trigger 8. Exercise induced bronchospasm Refilled Rx  9. Erectile dysfunction, unspecified erectile dysfunction type Refilled Rx  10. Need for influenza vaccination done  11. Seasonal  allergies Refilled Rx   12. Annual physical exam Labs ordered for future visit. Annual physical / preventive care was NOT performed or billed today.    No results found for this or any previous visit (from the past 24 hour(s)).  There are no Patient Instructions on file for this visit.  Orders Placed This Encounter  Procedures  . Flu Vaccine QUAD 6+ mos PF IM (Fluarix Quad PF)  . CBC  . COMPLETE METABOLIC PANEL WITH GFR  . Lipid panel  . PSA, Total with Reflex to PSA, Free  . Testosterone  . Ambulatory referral to Podiatry   Meds ordered this encounter  Medications  . amphetamine-dextroamphetamine (ADDERALL XR) 15 MG 24 hr capsule    Sig: Take 1 capsule by mouth every morning. If unable to disp #90 at a time, can disp #30 today w/ 0 refills; disp #30 30 days from today w/ 0 refills; disp #30 in 60 days w/ 0 refills.    Dispense:  90 capsule    Refill:  0  . amphetamine-dextroamphetamine (ADDERALL) 10 MG tablet    Sig: Take 1 tablet (10 mg total) by mouth daily with lunch. If unable to disp #90 at a time, can disp #30 today w/ 0 refills; disp #30 30 days from today w/ 0 refills; disp #30 in 60 days w/ 0 refills.    Dispense:  90 tablet    Refill:  0  . cetirizine (ZYRTEC) 10 MG tablet    Sig: Take 1 tablet (10 mg total) by mouth daily.    Dispense:  90 tablet    Refill:  3  . lisinopril (ZESTRIL)  10 MG tablet    Sig: Take 1 tablet (10 mg total) by mouth daily.    Dispense:  90 tablet    Refill:  3  . montelukast (SINGULAIR) 10 MG tablet    Sig: Take 1 tablet (10 mg total) by mouth daily.    Dispense:  90 tablet    Refill:  3  . sildenafil (VIAGRA) 100 MG tablet    Sig: Take 0.5-1 tablets (50-100 mg total) by mouth daily as needed for erectile dysfunction (please run goodrx coupon).    Dispense:  30 tablet    Refill:  11  . Testosterone (ANDROGEL PUMP) 20.25 MG/ACT (1.62%) GEL    Sig: Place 2 Pump onto the skin daily.    Dispense:  75 g    Refill:  2  .  lidocaine-prilocaine (EMLA) cream    Sig: Apply 1 application topically as needed.    Dispense:  30 g    Refill:  0  . hydrocortisone (ANUSOL-HC) 25 MG suppository    Sig: Place 1 suppository (25 mg total) rectally 2 (two) times daily as needed for hemorrhoids.    Dispense:  12 suppository    Refill:  2       Follow-up instructions: Return in about 3 months (around 10/15/2020) for ANNUAL AND LABS, REFILL MEDICATIONS FOR ADHD AND TESTOSTERONE .                                         BP 126/81 (BP Location: Left Arm, Patient Position: Sitting, Cuff Size: Large)   Pulse 72   Temp 97.8 F (36.6 C) (Oral)   Wt (!) 331 lb 1.9 oz (150.2 kg)   BMI 44.91 kg/m   Current Meds  Medication Sig  . amphetamine-dextroamphetamine (ADDERALL XR) 15 MG 24 hr capsule Take 1 capsule by mouth every morning. If unable to disp #90 at a time, can disp #30 today w/ 0 refills; disp #30 30 days from today w/ 0 refills; disp #30 in 60 days w/ 0 refills.  Marland Kitchen amphetamine-dextroamphetamine (ADDERALL) 10 MG tablet Take 1 tablet (10 mg total) by mouth daily with lunch. If unable to disp #90 at a time, can disp #30 today w/ 0 refills; disp #30 30 days from today w/ 0 refills; disp #30 in 60 days w/ 0 refills.  . cetirizine (ZYRTEC) 10 MG tablet Take 1 tablet (10 mg total) by mouth daily.  . ciclopirox (PENLAC) 8 % solution Apply topically at bedtime. Apply over nail and surrounding skin. Apply daily over previous coat. After seven (7) days, may remove with alcohol and continue cycle. Continue until normal nail grows out  . clotrimazole-betamethasone (LOTRISONE) cream Apply 1 application topically 2 (two) times daily.  . cromolyn (NASALCROM) 5.2 MG/ACT nasal spray Place 1 spray into both nostrils 3 (three) times daily. In each nostrl  . lisinopril (ZESTRIL) 10 MG tablet Take 1 tablet (10 mg total) by mouth daily.  . montelukast (SINGULAIR) 10 MG tablet Take 1 tablet (10 mg total) by  mouth daily.  . sildenafil (VIAGRA) 100 MG tablet Take 0.5-1 tablets (50-100 mg total) by mouth daily as needed for erectile dysfunction (please run goodrx coupon).  . Testosterone (ANDROGEL PUMP) 20.25 MG/ACT (1.62%) GEL Place 2 Pump onto the skin daily.  . [DISCONTINUED] amphetamine-dextroamphetamine (ADDERALL XR) 15 MG 24 hr capsule Take 1 capsule by mouth every morning.  . [DISCONTINUED]  amphetamine-dextroamphetamine (ADDERALL) 10 MG tablet Take 1 tablet (10 mg total) by mouth daily with lunch.  . [DISCONTINUED] cetirizine (ZYRTEC) 10 MG tablet Take 1 tablet (10 mg total) by mouth daily.  . [DISCONTINUED] lisinopril (ZESTRIL) 10 MG tablet Take 1 tablet (10 mg total) by mouth daily.  . [DISCONTINUED] montelukast (SINGULAIR) 10 MG tablet Take 1 tablet (10 mg total) by mouth daily.  . [DISCONTINUED] sildenafil (VIAGRA) 100 MG tablet Take 0.5-1 tablets (50-100 mg total) by mouth daily as needed for erectile dysfunction (please run goodrx coupon).  . [DISCONTINUED] Testosterone (ANDROGEL PUMP) 20.25 MG/ACT (1.62%) GEL Place 2 Pump onto the skin daily.    No results found for this or any previous visit (from the past 72 hour(s)).  No results found.     All questions at time of visit were answered - patient instructed to contact office with any additional concerns or updates.  ER/RTC precautions were reviewed with the patient as applicable.   Please note: voice recognition software was used to produce this document, and typos may escape review. Please contact Dr. Lyn Hollingshead for any needed clarifications.

## 2020-07-18 NOTE — Progress Notes (Signed)
Subjective:   Patient ID: Richard Cordova, male   DOB: 44 y.o.   MRN: [id number]   HPI Patient points to the left big toe stating that he has an ingrown toenail and it is been sore.  He had some drainage not significantly and has had this for several months and is tried to soak it trim it himself.  Patient does not smoke likes to be active   Review of Systems  All other systems reviewed and are negative.       Objective:  Physical Exam Vitals and nursing note reviewed.  Constitutional:      Appearance: He is well-developed.  Pulmonary:     Effort: Pulmonary effort is normal.  Musculoskeletal:        General: Normal range of motion.  Skin:    General: Skin is warm.  Neurological:     Mental Status: He is alert.     Neurovascular status found to be intact muscle strength adequate range of motion within normal limits.  Patient is found to have incurvated medial border left hallux painful when pressed make shoe gear difficult with slight redness no active drainage noted and painful secondary to the structure of the nail with history of having lost the toenail     Assessment:  Ingrown toenail deformity left hallux with pain     Plan:  H&P reviewed condition and recommended correction of deformity explained procedure to patient.  He signed consent form understanding risk and today infiltrated left hallux 60 mg like Marcaine mixture sterile prep done and using sterile instrumentation remove the border exposed matrix applied phenol 3 applications 30 seconds followed by alcohol lavage sterile dressing gave instructions on soaks and to leave dressing on 24 hours but take it off earlier if any throbbing were to occur and encouraged to call with questions concerns

## 2020-10-16 ENCOUNTER — Encounter: Payer: BC Managed Care – PPO | Admitting: Osteopathic Medicine

## 2020-11-07 ENCOUNTER — Other Ambulatory Visit: Payer: Self-pay | Admitting: Osteopathic Medicine

## 2020-11-07 DIAGNOSIS — F909 Attention-deficit hyperactivity disorder, unspecified type: Secondary | ICD-10-CM

## 2020-11-10 MED ORDER — AMPHETAMINE-DEXTROAMPHET ER 15 MG PO CP24: 15.0000 mg | ORAL_CAPSULE | ORAL | 0 refills | Status: DC

## 2021-07-21 ENCOUNTER — Telehealth: Payer: Self-pay

## 2021-07-21 NOTE — Telephone Encounter (Signed)
Patient will need an acute visit for refills of Adderall. Ok to keep establish care appointment but will need to be seen before then to get controlled substances.

## 2021-07-21 NOTE — Telephone Encounter (Signed)
TRANSFER OF CARE: Need Rx filled for Adderall, Montelukast, Lisinopril. Made an appt. For 09/25/2021 @ 1:40

## 2021-07-23 ENCOUNTER — Ambulatory Visit: Payer: BC Managed Care – PPO | Admitting: Podiatry

## 2021-07-27 ENCOUNTER — Other Ambulatory Visit: Payer: Self-pay

## 2021-07-27 ENCOUNTER — Encounter: Payer: Self-pay | Admitting: Family Medicine

## 2021-07-27 ENCOUNTER — Ambulatory Visit (INDEPENDENT_AMBULATORY_CARE_PROVIDER_SITE_OTHER): Payer: BC Managed Care – PPO | Admitting: Family Medicine

## 2021-07-27 ENCOUNTER — Ambulatory Visit: Payer: Self-pay

## 2021-07-27 VITALS — BP 118/78 | HR 87 | Ht 71.0 in | Wt 353.0 lb

## 2021-07-27 DIAGNOSIS — F909 Attention-deficit hyperactivity disorder, unspecified type: Secondary | ICD-10-CM | POA: Diagnosis not present

## 2021-07-27 DIAGNOSIS — J309 Allergic rhinitis, unspecified: Secondary | ICD-10-CM

## 2021-07-27 DIAGNOSIS — L821 Other seborrheic keratosis: Secondary | ICD-10-CM | POA: Insufficient documentation

## 2021-07-27 DIAGNOSIS — B379 Candidiasis, unspecified: Secondary | ICD-10-CM

## 2021-07-27 DIAGNOSIS — Z23 Encounter for immunization: Secondary | ICD-10-CM

## 2021-07-27 DIAGNOSIS — J4520 Mild intermittent asthma, uncomplicated: Secondary | ICD-10-CM

## 2021-07-27 DIAGNOSIS — I10 Essential (primary) hypertension: Secondary | ICD-10-CM

## 2021-07-27 MED ORDER — AMPHETAMINE-DEXTROAMPHETAMINE 10 MG PO TABS
10.0000 mg | ORAL_TABLET | Freq: Every day | ORAL | 0 refills | Status: DC
Start: 2021-08-26 — End: 2021-09-25

## 2021-07-27 MED ORDER — LISINOPRIL 10 MG PO TABS: 10.0000 mg | ORAL_TABLET | Freq: Every day | ORAL | 3 refills | Status: DC

## 2021-07-27 MED ORDER — AMPHETAMINE-DEXTROAMPHET ER 15 MG PO CP24
15.0000 mg | ORAL_CAPSULE | Freq: Every morning | ORAL | 0 refills | Status: DC
Start: 2021-08-26 — End: 2021-09-25

## 2021-07-27 MED ORDER — FLUCONAZOLE 150 MG PO TABS: 150.0000 mg | ORAL_TABLET | Freq: Every day | ORAL | 1 refills | Status: DC

## 2021-07-27 MED ORDER — MONTELUKAST SODIUM 10 MG PO TABS: 10.0000 mg | ORAL_TABLET | Freq: Every day | ORAL | 3 refills | Status: DC

## 2021-07-27 MED ORDER — AMPHETAMINE-DEXTROAMPHET ER 15 MG PO CP24: 15.0000 mg | ORAL_CAPSULE | ORAL | 0 refills | Status: DC

## 2021-07-27 MED ORDER — AMPHETAMINE-DEXTROAMPHETAMINE 10 MG PO TABS: 10.0000 mg | ORAL_TABLET | Freq: Every day | ORAL | 0 refills | Status: DC

## 2021-07-27 NOTE — Progress Notes (Signed)
Established Patient Office Visit  Subjective:  Patient ID: Richard Cordova, male    DOB: March 21, 1976  Age: 45 y.o. MRN: [id number]  CC:  Chief Complaint  Patient presents with   Medication Refill    HPI Richard Cordova presents for medication refill.   ADHD: Patient reports he has been doing 15 mg of Adderall standard release every morning and 10 mg every afternoon as needed.  Reports this is a good regimen for him with no side effects.  He is able to stay focused.  He does not have any chest pain, shortness of breath, tachycardia/palpitations, weight loss, loss of appetite, insomnia.  HYPERTENSION: - Medications: Lisinopril 10 mg - Compliance: Excellent - Checking BP at home: Yes typically at or below 120/80 - Denies any SOB, recurrent headaches, CP, vision changes, LE edema, dizziness, palpitations, or medication side effects.  ALLERGIC RHINITIS AND ASTHMA:  Doing well on current regimen.  Needs Singulair refilled.  No recent exacerbations.       Past Medical History:  Diagnosis Date   ADHD (attention deficit hyperactivity disorder), inattentive type 03/07/2014   Dr. Elisabeth Most - Started on Vyvanse 20mg  July 2015, additional 10mg  in the afternoon Aug 2015, Switched to Concerta Nov 2015 caused low libido, changed to Adderall XR 20mg  February 2016    Fatigue 07/24/2013   Hyperlipidemia 08/09/2013   Lipitor started June 2015    Hypotestosteronism 07/26/2013   Dose lowered June 2015, Labs Due Jan 2016    MRSA infection 07/24/2013   Obesity 07/24/2013   Secondary male hypogonadism 03/09/2017   Low FSH and LH 02/2017   Suspect osteomyelitis of fifth toe of left foot (HCC) 10/04/2017    Past Surgical History:  Procedure Laterality Date   VASECTOMY  03/2012    Family History  Problem Relation Age of Onset   Cancer Father        testicular   Alcoholism Father    Testicular cancer Father    Sleep apnea Father    Depression Father    Prostate cancer Father    Renal cancer Father     Alcoholism Mother    Depression Mother    Heart attack Paternal Uncle     Social History   Socioeconomic History   Marital status: Married    Spouse name: Not on file   Number of children: Not on file   Years of education: Not on file   Highest education level: Not on file  Occupational History   Not on file  Tobacco Use   Smoking status: Never   Smokeless tobacco: Never  Substance and Sexual Activity   Alcohol use: No   Drug use: No   Sexual activity: Yes  Other Topics Concern   Not on file  Social History Narrative   Not on file   Social Determinants of Health   Financial Resource Strain: Not on file  Food Insecurity: Not on file  Transportation Needs: Not on file  Physical Activity: Not on file  Stress: Not on file  Social Connections: Not on file  Intimate Partner Violence: Not on file    Outpatient Medications Prior to Visit  Medication Sig Dispense Refill   sildenafil (VIAGRA) 100 MG tablet Take 0.5-1 tablets (50-100 mg total) by mouth daily as needed for erectile dysfunction (please run goodrx coupon). 30 tablet 11   amphetamine-dextroamphetamine (ADDERALL) 10 MG tablet Take 1 tablet (10 mg total) by mouth daily with lunch. If unable to disp #90 at a time, can disp #  30 today w/ 0 refills; disp #30 30 days from today w/ 0 refills; disp #30 in 60 days w/ 0 refills. 90 tablet 0   montelukast (SINGULAIR) 10 MG tablet Take 1 tablet (10 mg total) by mouth daily. 90 tablet 3   cetirizine (ZYRTEC) 10 MG tablet Take 1 tablet (10 mg total) by mouth daily. (Patient not taking: Reported on 07/27/2021) 90 tablet 3   ciclopirox (PENLAC) 8 % solution Apply topically at bedtime. Apply over nail and surrounding skin. Apply daily over previous coat. After seven (7) days, may remove with alcohol and continue cycle. Continue until normal nail grows out (Patient not taking: Reported on 07/27/2021) 12 mL 2   clotrimazole-betamethasone (LOTRISONE) cream Apply 1 application topically 2  (two) times daily. (Patient not taking: Reported on 07/27/2021) 45 g 0   cromolyn (NASALCROM) 5.2 MG/ACT nasal spray Place 1 spray into both nostrils 3 (three) times daily. In each nostrl (Patient not taking: Reported on 07/27/2021) 26 mL 5   hydrocortisone (ANUSOL-HC) 25 MG suppository Place 1 suppository (25 mg total) rectally 2 (two) times daily as needed for hemorrhoids. (Patient not taking: Reported on 07/27/2021) 12 suppository 2   lidocaine-prilocaine (EMLA) cream Apply 1 application topically as needed. (Patient not taking: Reported on 07/27/2021) 30 g 0   Testosterone (ANDROGEL PUMP) 20.25 MG/ACT (1.62%) GEL Place 2 Pump onto the skin daily. 75 g 2   amphetamine-dextroamphetamine (ADDERALL XR) 15 MG 24 hr capsule Take 1 capsule by mouth every morning. If unable to disp #90 at a time, can disp #30 today w/ 0 refills; disp #30 30 days from today w/ 0 refills; disp #30 in 60 days w/ 0 refills. 90 capsule 0   lisinopril (ZESTRIL) 10 MG tablet Take 1 tablet (10 mg total) by mouth daily. (Patient not taking: Reported on 07/27/2021) 90 tablet 3   montelukast (SINGULAIR) 10 MG tablet Take by mouth.     No facility-administered medications prior to visit.    Allergies  Allergen Reactions   Coconut Fatty Acids Shortness Of Breath   Escitalopram Other (See Comments)    Low libido, ED    ROS Review of Systems All review of systems negative except what is listed in the HPI    Objective:    Physical Exam Vitals reviewed.  Constitutional:      Appearance: Normal appearance. He is obese.  Cardiovascular:     Rate and Rhythm: Normal rate and regular rhythm.     Heart sounds: Normal heart sounds.  Pulmonary:     Effort: Pulmonary effort is normal.     Breath sounds: Normal breath sounds.  Genitourinary:    Comments: Patient deferred exam; Skin:    General: Skin is warm and dry.  Neurological:     General: No focal deficit present.     Mental Status: He is alert and oriented to  person, place, and time.  Psychiatric:        Mood and Affect: Mood normal.        Behavior: Behavior normal.        Thought Content: Thought content normal.        Judgment: Judgment normal.    BP 118/78 (BP Location: Left Arm, Patient Position: Sitting, Cuff Size: Large)   Pulse 87   Ht 5\' 11"  (1.803 m)   Wt (!) 353 lb (160.1 kg)   SpO2 97%   BMI 49.23 kg/m  Wt Readings from Last 3 Encounters:  07/27/21 (!) 353 lb (160.1 kg)  07/17/20 (!) 331 lb 1.9 oz (150.2 kg)  12/27/19 (!) 317 lb 1.9 oz (143.8 kg)     Health Maintenance Due  Topic Date Due   HIV Screening  Never done   Hepatitis C Screening  Never done   COVID-19 Vaccine (3 - Pfizer risk series) 12/18/2019    There are no preventive care reminders to display for this patient.  Lab Results  Component Value Date   TSH 2.32 03/07/2017   Lab Results  Component Value Date   WBC 9.4 06/12/2019   HGB 15.3 06/12/2019   HCT 45.1 06/12/2019   MCV 83.2 06/12/2019   PLT 260 06/12/2019   Lab Results  Component Value Date   NA 139 12/11/2019   K 4.8 12/11/2019   CO2 30 12/11/2019   GLUCOSE 97 12/11/2019   BUN 13 12/11/2019   CREATININE 0.86 12/11/2019   BILITOT 0.7 12/11/2019   ALKPHOS 74 03/07/2017   AST 17 12/11/2019   ALT 26 12/11/2019   PROT 7.2 12/11/2019   ALBUMIN 4.2 03/07/2017   ALBUMIN 4.2 03/07/2017   CALCIUM 9.6 12/11/2019   Lab Results  Component Value Date   CHOL 203 (H) 06/12/2019   Lab Results  Component Value Date   HDL 50 06/12/2019   Lab Results  Component Value Date   LDLCALC 130 (H) 06/12/2019   Lab Results  Component Value Date   TRIG 122 06/12/2019   Lab Results  Component Value Date   CHOLHDL 4.1 06/12/2019   Lab Results  Component Value Date   HGBA1C 4.6 06/12/2019      Assessment & Plan:   1. Allergic rhinitis, unspecified seasonality, unspecified trigger - montelukast (SINGULAIR) 10 MG tablet; Take 1 tablet (10 mg total) by mouth daily.  Dispense: 90 tablet;  Refill: 3  2. Hypertension goal BP (blood pressure) < 130/80 Blood pressure is at goal at for age and co-morbidities.  I recommend continue regimen.  In addition they were instructed on the following: - BP goal <130/80 - monitor and log blood pressures at home - check around the same time each day in a relaxed setting - Limit salt to <2000 mg/day - Follow DASH eating plan (heart healthy diet) - limit alcohol to 2 standard drinks per day for men and 1 per day for women - avoid tobacco products - get at least 2 hours of regular aerobic exercise weekly Patient aware of signs/symptoms requiring further/urgent evaluation. Labs updated today.   - lisinopril (ZESTRIL) 10 MG tablet; Take 1 tablet (10 mg total) by mouth daily.  Dispense: 90 tablet; Refill: 3 - BASIC METABOLIC PANEL WITH GFR  3. Adult ADHD Doing well today.  Continue current regimen.  Refill for this month placed and new order for the following month placed as well.  He is scheduled to follow-up with new PCP after that.  - amphetamine-dextroamphetamine (ADDERALL) 10 MG tablet; Take 1 tablet (10 mg total) by mouth daily with lunch.  Dispense: 30 tablet; Refill: 0 - amphetamine-dextroamphetamine (ADDERALL XR) 15 MG 24 hr capsule; Take 1 capsule by mouth every morning.  Dispense: 30 capsule; Refill: 0 - amphetamine-dextroamphetamine (ADDERALL XR) 15 MG 24 hr capsule; Take 1 capsule by mouth in the morning.  Dispense: 30 capsule; Refill: 0 - amphetamine-dextroamphetamine (ADDERALL) 10 MG tablet; Take 1 tablet (10 mg total) by mouth daily in the afternoon.  Dispense: 30 tablet; Refill: 0  4. Yeast infection At the very end of appointment patient mentions some jock itch.  States he  has not done well with topical treatments in the past.  He is requesting Diflucan.  He deferred exam today. - fluconazole (DIFLUCAN) 150 MG tablet; Take 1 tablet (150 mg total) by mouth daily.  Dispense: 1 tablet; Refill: 1  5. Mild intermittent asthma  without complication 6. Need for pneumococcal vaccination - Pneumococcal conjugate vaccine 20-valent (Prevnar 20)  7. Need for influenza vaccination - Flu Vaccine QUAD 6+ mos PF IM (Fluarix Quad PF)      Follow-up: Return for as scheduled with Joy or sooner if needed. Clayborne Dana, NP

## 2021-07-28 LAB — BASIC METABOLIC PANEL WITH GFR
BUN: 12 mg/dL (ref 7–25)
CO2: 27 mmol/L (ref 20–32)
Calcium: 9.2 mg/dL (ref 8.6–10.3)
Chloride: 103 mmol/L (ref 98–110)
Creat: 0.86 mg/dL (ref 0.60–1.29)
Glucose, Bld: 123 mg/dL — ABNORMAL HIGH (ref 65–99)
Potassium: 4.2 mmol/L (ref 3.5–5.3)
Sodium: 141 mmol/L (ref 135–146)
eGFR: 109 mL/min/{1.73_m2} (ref >=60)

## 2021-09-25 ENCOUNTER — Encounter: Payer: Self-pay | Admitting: Medical-Surgical

## 2021-09-25 ENCOUNTER — Other Ambulatory Visit: Payer: Self-pay

## 2021-09-25 ENCOUNTER — Ambulatory Visit: Payer: BC Managed Care – PPO | Admitting: Medical-Surgical

## 2021-09-25 VITALS — BP 133/85 | HR 73 | Resp 20 | Ht 71.0 in | Wt 346.0 lb

## 2021-09-25 DIAGNOSIS — Z7689 Persons encountering health services in other specified circumstances: Secondary | ICD-10-CM | POA: Diagnosis not present

## 2021-09-25 DIAGNOSIS — F900 Attention-deficit hyperactivity disorder, predominantly inattentive type: Secondary | ICD-10-CM | POA: Diagnosis not present

## 2021-09-25 DIAGNOSIS — F909 Attention-deficit hyperactivity disorder, unspecified type: Secondary | ICD-10-CM

## 2021-09-25 DIAGNOSIS — N521 Erectile dysfunction due to diseases classified elsewhere: Secondary | ICD-10-CM

## 2021-09-25 DIAGNOSIS — I10 Essential (primary) hypertension: Secondary | ICD-10-CM | POA: Diagnosis not present

## 2021-09-25 MED ORDER — AMPHETAMINE-DEXTROAMPHET ER 15 MG PO CP24: 15.0000 mg | ORAL_CAPSULE | Freq: Every morning | ORAL | 0 refills | Status: DC

## 2021-09-25 MED ORDER — AMPHETAMINE-DEXTROAMPHET ER 15 MG PO CP24: 15.0000 mg | ORAL_CAPSULE | ORAL | 0 refills | Status: DC

## 2021-09-25 MED ORDER — AMPHETAMINE-DEXTROAMPHETAMINE 10 MG PO TABS: 10.0000 mg | ORAL_TABLET | Freq: Every day | ORAL | 0 refills | Status: DC

## 2021-09-25 MED ORDER — OZEMPIC (0.25 OR 0.5 MG/DOSE) 2 MG/1.5ML ~~LOC~~ SOPN: 0.5000 mg | PEN_INJECTOR | SUBCUTANEOUS | 0 refills | Status: DC

## 2021-09-25 NOTE — Progress Notes (Signed)
°  HPI with pertinent ROS:   CC: Transfer care  HPI: Pleasant 46 year old male presenting today to transfer care to a new PCP.  He is also here to follow-up on ADHD.  ADHD-has been taking Adderall 15 mg XR daily and Adderall 10 mg IR in the afternoons.  Tolerating both medications well without side effects.  Has been on this treatment for approximately 3 years at the same dose with stable symptoms.  Uses the 10 mg afternoon dose on an as-needed basis and notes that he does take this when the benefits of the medication outweighs the increased jitteriness that it causes.  No changes in appetite, weight, or sleeping patterns.  Hypertension-taking lisinopril 10 mg daily as prescribed, tolerating well without side effects. Denies CP, SOB, palpitations, lower extremity edema, dizziness, headaches, or vision changes.  Erectile dysfunction-using sildenafil very sparingly and notes that the 100 mg dose is a little too strong for him.  When he needs this refilled he will let me know but would like to have the 20 mg - 100 mg dosing option sent to the pharmacy.  Notes that he is interested in possibly starting a weight loss medication that he has heard about on TV and on social media.  He is interested in Ozempic and would like to see if this will be covered by his insurance today.  I reviewed the past medical history, family history, social history, surgical history, and allergies today and no changes were needed.  Please see the problem list section below in epic for further details.   Physical exam:   General: Well Developed, well nourished, and in no acute distress.  Neuro: Alert and oriented x3.  HEENT: Normocephalic, atraumatic.  Skin: Warm and dry. Cardiac: Regular rate and rhythm, no murmurs rubs or gallops, no lower extremity edema.  Respiratory: Clear to auscultation bilaterally. Not using accessory muscles, speaking in full sentences.  Impression and Recommendations:    1. Encounter to  establish care Reviewed available information and discussed care concerns with patient.   2. ADHD (attention deficit hyperactivity disorder), inattentive type Symptoms stable.  Continue Adderall XR 15 mg daily with a 10 mg IR dose in the afternoon as needed.  3. Hypertension goal BP (blood pressure) < 130/80 Blood pressure at goal today.  Continue lisinopril 10 mg daily.  4. Erectile dysfunction due to diseases classified elsewhere No refills needed at this time but when he does request 1, please plan to change to 20 mg tablets with the instructions of 20-100 mg daily as needed.  5. Encounter for weight management Discussed medications that may help with weight management.  He would like to try Ozempic so sending this to the pharmacy.  Advised him that his insurance will likely not cover this and if they do, the cost may be prohibitive.  Return in about 6 months (around 03/25/2022) for chronic disease follow up. ___________________________________________ Thayer Ohm, DNP, APRN, FNP-BC Primary Care and Sports Medicine [facility] [place]

## 2021-10-27 ENCOUNTER — Other Ambulatory Visit: Payer: Self-pay | Admitting: Medical-Surgical

## 2021-11-05 ENCOUNTER — Other Ambulatory Visit: Payer: Self-pay

## 2021-11-05 ENCOUNTER — Encounter: Payer: Self-pay | Admitting: Emergency Medicine

## 2021-11-05 ENCOUNTER — Emergency Department
Admission: EM | Admit: 2021-11-05 | Discharge: 2021-11-05 | Disposition: A | Discharge status: Home / Self Care | Payer: BC Managed Care – PPO | Source: Home / Self Care | Attending: Family Medicine | Admitting: Family Medicine

## 2021-11-05 DIAGNOSIS — H9202 Otalgia, left ear: Secondary | ICD-10-CM

## 2021-11-05 MED ORDER — AMOXICILLIN 875 MG PO TABS: 875.0000 mg | ORAL_TABLET | Freq: Two times a day (BID) | ORAL | 0 refills | Status: DC

## 2021-11-05 MED ORDER — PREDNISONE 20 MG PO TABS: 20.0000 mg | ORAL_TABLET | Freq: Two times a day (BID) | ORAL | 0 refills | Status: DC

## 2021-11-05 NOTE — Discharge Instructions (Signed)
Make sure you are drinking lots of water ?Take the prednisone once a day for 5 days ?Take the antibiotic twice a day for 7 days ?See your doctor if not improving by next week ?

## 2021-11-05 NOTE — ED Provider Notes (Addendum)
?[facility] ? ? ? ?CSN: [id number] ?Arrival date & time: 11/05/21  1435 ? ? ?  ? ?History   ?Chief Complaint ?Chief Complaint  ?Patient presents with  ? Otalgia  ? ? ?HPI ?Richard HammockJohn Cordova is a 46 y.o. male.  ? ?HPI ? ?Patient states that he had a "bad cold" couple weeks ago.  He is left with left ear pain.  Is been going on for couple of weeks.  He states it is getting worse.  He states it feels like a pressure sensation.  His hearing is normal.  He no longer has any runny stuffy nose sore throat or cough.  He states he does have allergies.  He did take over-the-counter cough and cold medicines.  These failed to help ? ?Past Medical History:  ?Diagnosis Date  ? ADHD (attention deficit hyperactivity disorder), inattentive type 03/07/2014  ? Dr. Elisabeth MostStevenson - Started on Vyvanse 20mg  July 2015, additional 10mg  in the afternoon Aug 2015, Switched to Concerta Nov 2015 caused low libido, changed to Adderall XR 20mg  February 2016   ? Fatigue 07/24/2013  ? Hyperlipidemia 08/09/2013  ? Lipitor started June 2015   ? Hypotestosteronism 07/26/2013  ? Dose lowered June 2015, Labs Due Jan 2016   ? MRSA infection 07/24/2013  ? Obesity 07/24/2013  ? Secondary male hypogonadism 03/09/2017  ? Low FSH and LH 02/2017  ? Suspect osteomyelitis of fifth toe of left foot (HCC) 10/04/2017  ? ? ?Patient Active Problem List  ? Diagnosis Date Noted  ? Other seborrheic keratosis 07/27/2021  ? Hemorrhoids 07/17/2020  ? Ingrown toenail 07/17/2020  ? Encounter for long-term (current) use of medications 05/17/2018  ? Allergic rhinitis 05/17/2018  ? Persistent cough for 3 weeks or longer 01/31/2018  ? Carpal tunnel syndrome 10/19/2017  ? Suspect osteomyelitis of fifth toe of left foot (HCC) 10/04/2017  ? Family history of prostate cancer in father 09/21/2017  ? Exercise induced bronchospasm 09/21/2017  ? Tinea cruris 09/21/2017  ? Seasonal allergies 09/21/2017  ? Erectile dysfunction due to diseases classified elsewhere 04/04/2017  ? Hypertension goal BP  (blood pressure) < 130/80 04/04/2017  ? Low serum follicle stimulating hormone (FSH) 03/11/2017  ? Low serum luteinizing hormone (LH) 03/11/2017  ? Shortness of breath 03/10/2017  ? At risk for obstructive sleep apnea 03/10/2017  ? Major depression in remission (HCC) 03/10/2017  ? Snoring 03/10/2017  ? Secondary male hypogonadism 03/09/2017  ? ADHD (attention deficit hyperactivity disorder), inattentive type 03/07/2014  ? Question of Remote HSV infection 08/29/2013  ? Hyperlipidemia 08/09/2013  ? Class 3 severe obesity due to excess calories with serious comorbidity and body mass index (BMI) of 45.0 to 49.9 in adult [facility](HCC) 07/24/2013  ? Fatigue 07/24/2013  ? ? ?Past Surgical History:  ?Procedure Laterality Date  ? VASECTOMY  03/2012  ? ? ? ? ? ?Home Medications   ? ?Prior to Admission medications   ?Medication Sig Start Date End Date Taking? Authorizing Provider  ?amoxicillin (AMOXIL) 875 MG tablet Take 1 tablet (875 mg total) by mouth 2 (two) times daily. 11/05/21  Yes Eustace MooreNelson, Morio Widen Sue, MD  ?predniSONE (DELTASONE) 20 MG tablet Take 1 tablet (20 mg total) by mouth 2 (two) times daily with a meal. 11/05/21  Yes Eustace MooreNelson, Jetta Murray Sue, MD  ?amphetamine-dextroamphetamine (ADDERALL XR) 15 MG 24 hr capsule Take 1 capsule by mouth in the morning. 11/24/21   Christen ButterJessup, Joy, NP  ?amphetamine-dextroamphetamine (ADDERALL XR) 15 MG 24 hr capsule Take 1 capsule by mouth every morning. 09/25/21  10/25/21  Christen Butter, NP  ?lisinopril (ZESTRIL) 10 MG tablet Take 1 tablet (10 mg total) by mouth daily. 07/27/21   Clayborne Dana, NP  ?montelukast (SINGULAIR) 10 MG tablet Take 1 tablet (10 mg total) by mouth daily. 07/27/21   Clayborne Dana, NP  ?OZEMPIC, 0.25 OR 0.5 MG/DOSE, 2 MG/1.5ML SOPN INJECT 0.5 MG INTO THE SKIN ONCE A WEEK. 10/27/21   Christen Butter, NP  ?sildenafil (VIAGRA) 100 MG tablet Take 0.5-1 tablets (50-100 mg total) by mouth daily as needed for erectile dysfunction (please run goodrx coupon). 07/17/20   Sunnie Nielsen, DO   ?Testosterone (ANDROGEL PUMP) 20.25 MG/ACT (1.62%) GEL Place 2 Pump onto the skin daily. 07/17/20 07/17/21  Sunnie Nielsen, DO  ? ? ?Family History ?Family History  ?Problem Relation Age of Onset  ? Cancer Father   ?     testicular  ? Alcoholism Father   ? Testicular cancer Father   ? Sleep apnea Father   ? Depression Father   ? Prostate cancer Father   ? Renal cancer Father   ? Alcoholism Mother   ? Depression Mother   ? Heart attack Paternal Uncle   ? ? ?Social History ?Social History  ? ?Tobacco Use  ? Smoking status: Never  ? Smokeless tobacco: Never  ?Vaping Use  ? Vaping Use: Never used  ?Substance Use Topics  ? Alcohol use: No  ? Drug use: No  ? ? ? ?Allergies   ?Coconut fatty acids and Escitalopram ? ? ?Review of Systems ?Review of Systems ? ?See HPI ?Physical Exam ?Triage Vital Signs ?ED Triage Vitals  ?Enc Vitals Group  ?   BP 11/05/21 1449 116/76  ?   Pulse Rate 11/05/21 1449 68  ?   Resp 11/05/21 1449 18  ?   Temp 11/05/21 1449 97.8 ?F (36.6 ?C)  ?   Temp Source 11/05/21 1449 Oral  ?   SpO2 11/05/21 1449 99 %  ?   Weight --   ?   Height --   ?   Head Circumference --   ?   Peak Flow --   ?   Pain Score 11/05/21 1451 4  ?   Pain Loc --   ?   Pain Edu? --   ?   Excl. in GC? --   ? ?No data found. ? ?Updated Vital Signs ?BP 116/76 (BP Location: Right Arm)   Pulse 68   Temp 97.8 ?F (36.6 ?C) (Oral)   Resp 18   SpO2 99%  ?   ? ?Physical Exam ?Constitutional:   ?   General: He is not in acute distress. ?   Appearance: He is well-developed. He is obese.  ?HENT:  ?   Head: Normocephalic and atraumatic.  ?   Right Ear: Tympanic membrane and ear canal normal.  ?   Left Ear: Ear canal and external ear normal.  ?   Ears:  ?   Comments: Left TM is dull with abnormal light reflex.  No erythema ?   Nose: Nose normal. No congestion.  ?   Mouth/Throat:  ?   Mouth: Mucous membranes are moist.  ?   Pharynx: Posterior oropharyngeal erythema present.  ?Eyes:  ?   Conjunctiva/sclera: Conjunctivae normal.  ?   Pupils:  Pupils are equal, round, and reactive to light.  ?Cardiovascular:  ?   Rate and Rhythm: Normal rate and regular rhythm.  ?   Heart sounds: Normal heart sounds.  ?Pulmonary:  ?  Effort: Pulmonary effort is normal. No respiratory distress.  ?Abdominal:  ?   General: There is no distension.  ?   Palpations: Abdomen is soft.  ?Musculoskeletal:     ?   General: Normal range of motion.  ?   Cervical back: Normal range of motion.  ?Lymphadenopathy:  ?   Cervical: No cervical adenopathy.  ?Skin: ?   General: Skin is warm and dry.  ?Neurological:  ?   General: No focal deficit present.  ?   Mental Status: He is alert.  ? ? ? ?UC Treatments / Results  ?Labs ?(all labs ordered are listed, but only abnormal results are displayed) ?Labs Reviewed - No data to display ? ?EKG ? ? ?Radiology ?No results found. ? ?Procedures ?Procedures (including critical care time) ? ?Medications Ordered in UC ?Medications - No data to display ? ?Initial Impression / Assessment and Plan / UC Course  ?I have reviewed the triage vital signs and the nursing notes. ? ?Pertinent labs & imaging results that were available during my care of the patient were reviewed by me and considered in my medical decision making (see chart for details). ? ?  ? ?Final Clinical Impressions(s) / UC Diagnoses  ? ?Final diagnoses:  ?Left ear pain  ? ? ? ?Discharge Instructions   ? ?  ?Make sure you are drinking lots of water ?Take the prednisone once a day for 5 days ?Take the antibiotic twice a day for 7 days ?See your doctor if not improving by next week ? ? ? ?ED Prescriptions   ? ? Medication Sig Dispense Auth. Provider  ? amoxicillin (AMOXIL) 875 MG tablet Take 1 tablet (875 mg total) by mouth 2 (two) times daily. 14 tablet Eustace Moore, MD  ? predniSONE (DELTASONE) 20 MG tablet Take 1 tablet (20 mg total) by mouth 2 (two) times daily with a meal. 10 tablet Eustace Moore, MD  ? ?  ? ?PDMP not reviewed this encounter. ?  ?Eustace Moore, MD ?11/05/21  1525 ? ?  ?Eustace Moore, MD ?11/05/21 1627 ? ?

## 2021-11-05 NOTE — ED Triage Notes (Signed)
Pt c/o left ear pain for the last several weeks and getting worse.  ?

## 2021-12-19 DIAGNOSIS — I2699 Other pulmonary embolism without acute cor pulmonale: Secondary | ICD-10-CM | POA: Insufficient documentation

## 2021-12-21 DIAGNOSIS — D649 Anemia, unspecified: Secondary | ICD-10-CM | POA: Insufficient documentation

## 2021-12-22 NOTE — Progress Notes (Signed)
? ?Established Patient Office Visit ? ?Subjective   ?Patient ID: Richard Cordova, male    DOB: 30-Aug-1975  Age: 46 y.o. MRN: [id number] ? ?Chief Complaint  ?Patient presents with  ? Hospitalization Follow-up  ? ? ?HPI ?Very pleasant 46 year old male presenting today for hospital discharge follow-up.  He was seen on 12/19/21 at [facility] after experiencing shortness of breath and upper back pain that developed while he was working.  EMS was called and they were able to do an EKG which was reassuring however he was still taken to the hospital for further evaluation.  Once he got to the ED, he underwent a CT scan which found a pulmonary embolism.  He was admitted and stayed hospitalized for 3 days before being discharged home on Eliquis.  Today, he reports that he does still have some dyspnea on exertion and activity intolerance.  He is taking Eliquis 10 mg twice daily as prescribed and will go down to the 5 mg twice daily as instructed when due.  He has stopped taking all NSAIDs and has only been taking Tylenol for discomfort. ? ?Also interested in getting a sleep study done.  Notes his wife tells him he is a very loud snorer and he is often very tired during the day. ? ?STOP-BANG for SLEEP APNEA ?Do you Snore loudly? Yes ?Do you often feel Tired during day? Yes ?Has anyone Observed you stop breathing? No ?History of high blood Pressure? Yes ?BMI >35? Yes ?Age >50? No ?Neck circumference >16 in? Yes ?Gender male? Yes ?5-8 = high risk ?3-4 = intermediate ?0-2 = low risk  ? ? ?Review of Systems  ?Constitutional:  Negative for chills and fever.  ?Respiratory:  Positive for shortness of breath.   ?Cardiovascular:  Negative for chest pain, palpitations and orthopnea.  ?Musculoskeletal:  Positive for back pain.  ? ?  ?Objective:  ?  ? ?BP 128/84 (BP Location: Right Arm, Cuff Size: Large)   Pulse (!) 102   Resp 20   Ht 5\' 11"  (1.803 m)   Wt (!) 335 lb 1.6 oz (152 kg)   SpO2 95%   BMI 46.74 kg/m?  ? ? ?Physical Exam ?Vitals and  nursing note reviewed.  ?Constitutional:   ?   General: He is not in acute distress. ?   Appearance: Normal appearance. He is obese. He is not ill-appearing.  ?HENT:  ?   Head: Normocephalic and atraumatic.  ?Cardiovascular:  ?   Rate and Rhythm: Normal rate and regular rhythm.  ?   Pulses: Normal pulses.  ?   Heart sounds: Normal heart sounds. No murmur heard. ?  No friction rub. No gallop.  ?Pulmonary:  ?   Effort: Pulmonary effort is normal. No respiratory distress.  ?   Breath sounds: Decreased breath sounds (Bilateral posterior lobes) present.  ?Skin: ?   General: Skin is warm and dry.  ?Neurological:  ?   Mental Status: He is alert and oriented to person, place, and time.  ?Psychiatric:     ?   Mood and Affect: Mood normal.     ?   Behavior: Behavior normal.     ?   Thought Content: Thought content normal.     ?   Judgment: Judgment normal.  ? ?No results found for any visits on 12/23/21. ? ?The 10-year ASCVD risk score (Arnett DK, et al., 2019) is: 2.2% ? ?  ?Assessment & Plan:  ? ?1. Hospital discharge follow-up ?2. Acute pulmonary embolism, unspecified pulmonary embolism type,  unspecified whether acute cor pulmonale present (HCC) ?Follow-up with hematology next week as instructed.  Continue Eliquis as prescribed.  Discussed emergency signs or symptoms that should prompt immediate evaluation.  Continue to avoid NSAIDs. ? ?3. Loud snoring ?4. Excessive daytime sleepiness ?Stop bang score of 6, high risk for sleep apnea.  Ordering home sleep study today. ?- Home sleep test; Future ? ?Return for ADHD follow-up as scheduled in August.  ?___________________________________________ ?Thayer Ohm, DNP, APRN, FNP-BC ?Primary Care and Sports Medicine ?[facility] ? ? ?

## 2021-12-23 ENCOUNTER — Encounter: Payer: Self-pay | Admitting: Medical-Surgical

## 2021-12-23 ENCOUNTER — Ambulatory Visit: Payer: BC Managed Care – PPO | Admitting: Medical-Surgical

## 2021-12-23 VITALS — BP 128/84 | HR 102 | Resp 20 | Ht 71.0 in | Wt 335.1 lb

## 2021-12-23 DIAGNOSIS — I2699 Other pulmonary embolism without acute cor pulmonale: Secondary | ICD-10-CM

## 2021-12-23 DIAGNOSIS — Z09 Encounter for follow-up examination after completed treatment for conditions other than malignant neoplasm: Secondary | ICD-10-CM | POA: Diagnosis not present

## 2021-12-23 DIAGNOSIS — R0683 Snoring: Secondary | ICD-10-CM

## 2021-12-23 DIAGNOSIS — G4719 Other hypersomnia: Secondary | ICD-10-CM | POA: Diagnosis not present

## 2021-12-23 MED ORDER — ALBUTEROL SULFATE HFA 108 (90 BASE) MCG/ACT IN AERS: 2.0000 | INHALATION_SPRAY | Freq: Four times a day (QID) | RESPIRATORY_TRACT | 5 refills | Status: DC | PRN

## 2022-01-25 ENCOUNTER — Other Ambulatory Visit: Payer: Self-pay | Admitting: Medical-Surgical

## 2022-01-25 DIAGNOSIS — F909 Attention-deficit hyperactivity disorder, unspecified type: Secondary | ICD-10-CM

## 2022-01-25 NOTE — Telephone Encounter (Signed)
Spoke with Marjory Lies at [facility] for fill date 11/24/2021 has not been filled.  They will fill the prescription for the patient now.  Charyl Bigger, CMA

## 2022-03-05 ENCOUNTER — Ambulatory Visit (HOSPITAL_BASED_OUTPATIENT_CLINIC_OR_DEPARTMENT_OTHER): Payer: BC Managed Care – PPO | Admitting: Internal Medicine

## 2022-03-05 DIAGNOSIS — R0683 Snoring: Secondary | ICD-10-CM

## 2022-03-05 DIAGNOSIS — G4719 Other hypersomnia: Secondary | ICD-10-CM

## 2022-03-11 ENCOUNTER — Ambulatory Visit (HOSPITAL_BASED_OUTPATIENT_CLINIC_OR_DEPARTMENT_OTHER): Payer: BC Managed Care – PPO | Attending: Medical-Surgical | Admitting: Internal Medicine

## 2022-03-11 DIAGNOSIS — G4719 Other hypersomnia: Secondary | ICD-10-CM | POA: Diagnosis not present

## 2022-03-11 DIAGNOSIS — G4733 Obstructive sleep apnea (adult) (pediatric): Secondary | ICD-10-CM | POA: Diagnosis not present

## 2022-03-11 DIAGNOSIS — R0683 Snoring: Secondary | ICD-10-CM | POA: Diagnosis present

## 2022-03-13 ENCOUNTER — Other Ambulatory Visit: Payer: Self-pay | Admitting: Medical-Surgical

## 2022-03-13 DIAGNOSIS — F909 Attention-deficit hyperactivity disorder, unspecified type: Secondary | ICD-10-CM

## 2022-03-15 MED ORDER — AMPHETAMINE-DEXTROAMPHET ER 15 MG PO CP24: 15.0000 mg | ORAL_CAPSULE | Freq: Every morning | ORAL | 0 refills | Status: DC

## 2022-03-18 ENCOUNTER — Other Ambulatory Visit: Payer: Self-pay | Admitting: Medical-Surgical

## 2022-03-18 DIAGNOSIS — F909 Attention-deficit hyperactivity disorder, unspecified type: Secondary | ICD-10-CM

## 2022-03-18 NOTE — Telephone Encounter (Signed)
Duplicate request. Unable to denied auto refill request from pharmacy.

## 2022-03-20 DIAGNOSIS — R0683 Snoring: Secondary | ICD-10-CM | POA: Diagnosis not present

## 2022-03-20 DIAGNOSIS — G4719 Other hypersomnia: Secondary | ICD-10-CM | POA: Diagnosis not present

## 2022-03-20 NOTE — Procedures (Signed)
    Patient Name: Richard Cordova, Richard Cordova Date: 03/11/2022 Gender: Male D.O.B: [birth year] Age (years): 76 Referring Provider: Christen Butter NP Height (inches): 70 Interpreting Physician: Jetty Duhamel MD, ABSM Weight (lbs): 325 RPSGT: [place]er, Holly BMI: 47 MRN: [id number] Neck Size: 20.00  CLINICAL INFORMATION Sleep Study Type: HST Indication for sleep study: Snoring Epworth Sleepiness Score: 3  SLEEP STUDY TECHNIQUE A multi-channel overnight portable sleep study was performed. The channels recorded were: nasal airflow, thoracic respiratory movement, and oxygen saturation with a pulse oximetry. Snoring was also monitored.  MEDICATIONS Patient self administered medications include: none reported.  SLEEP ARCHITECTURE Patient was studied for 364.5 minutes. The sleep efficiency was 100.0 % and the patient was supine for 0%. The arousal index was 0.0 per hour.  RESPIRATORY PARAMETERS The overall AHI was 26.5 per hour, with a central apnea index of 0 per hour. The oxygen nadir was 86% during sleep.  CARDIAC DATA Mean heart rate during sleep was 70.9 bpm.  IMPRESSIONS - Moderate obstructive sleep apnea occurred during this study (AHI = 26.5/h). - Moderate oxygen desaturation was noted during this study (Min O2 = 86%). Mean 95% - Patient snored.  DIAGNOSIS - Obstructive Sleep Apnea (G47.33)  RECOMMENDATIONS - Suggest CPAP titration sleep study or autopap. Other options wwould be based on clinical judgment. - Be careful with alcohol, sedatives and other CNS depressants that may worsen sleep apnea and disrupt normal sleep architecture. - Sleep hygiene should be reviewed to assess factors that may improve sleep quality. - Weight management and regular exercise should be initiated or continued.  [Electronically signed] 03/20/2022 11:16 AM  Jetty Duhamel MD, ABSM Diplomate, American Board of Sleep Medicine NPI: [id number]                         Jetty Duhamel Diplomate, American Board of Sleep Medicine  ELECTRONICALLY SIGNED ON:  03/20/2022, 11:14 AM [facility] PH: [phone]   FX: [phone] ACCREDITED BY THE AMERICAN ACADEMY OF SLEEP MEDICINE

## 2022-03-23 NOTE — Progress Notes (Unsigned)
   Established Patient Office Visit  Subjective   Patient ID: Richard Cordova, male   DOB: [birth year] Age: 46 y.o. MRN: [id number]   No chief complaint on file.   HPI Pleasant 46 year old male presenting today to follow-up on:  Sleep apnea:  ADHD:   Objective:    There were no vitals filed for this visit.  Physical Exam   No results found for this or any previous visit (from the past 24 hour(s)).   {Labs (Optional):23779}  The 10-year ASCVD risk score (Arnett DK, et al., 2019) is: 1.7%   Values used to calculate the score:     Age: 46 years     Sex: Male     Is Non-Hispanic African American: No     Diabetic: No     Tobacco smoker: No     Systolic Blood Pressure: 112 mmHg     Is BP treated: Yes     HDL Cholesterol: 50 mg/dL     Total Cholesterol: 203 mg/dL   Assessment & Plan:   No problem-specific Assessment & Plan notes found for this encounter.  No follow-ups on file.  ___________________________________________ Thayer Ohm, DNP, APRN, FNP-BC Primary Care and Sports Medicine [facility]

## 2022-03-25 ENCOUNTER — Ambulatory Visit: Payer: BC Managed Care – PPO | Admitting: Medical-Surgical

## 2022-03-25 ENCOUNTER — Encounter: Payer: Self-pay | Admitting: Medical-Surgical

## 2022-03-25 VITALS — BP 108/70 | HR 65 | Resp 20 | Ht 70.0 in | Wt 341.3 lb

## 2022-03-25 DIAGNOSIS — G4733 Obstructive sleep apnea (adult) (pediatric): Secondary | ICD-10-CM | POA: Diagnosis not present

## 2022-03-25 DIAGNOSIS — F909 Attention-deficit hyperactivity disorder, unspecified type: Secondary | ICD-10-CM

## 2022-03-25 MED ORDER — AMPHETAMINE-DEXTROAMPHET ER 15 MG PO CP24: 15.0000 mg | ORAL_CAPSULE | ORAL | 0 refills | Status: DC

## 2022-03-25 MED ORDER — AMBULATORY NON FORMULARY MEDICATION: 0 refills | Status: AC

## 2022-03-25 MED ORDER — AMPHETAMINE-DEXTROAMPHET ER 15 MG PO CP24: 15.0000 mg | ORAL_CAPSULE | Freq: Every morning | ORAL | 0 refills | Status: DC

## 2022-03-31 ENCOUNTER — Encounter: Payer: Self-pay | Admitting: Medical-Surgical

## 2022-04-01 ENCOUNTER — Telehealth: Payer: Self-pay

## 2022-04-01 NOTE — Telephone Encounter (Addendum)
Initiated Prior authorization GYB:[id number] ER 15MG  er capsules Via: Covermymeds Case/Key:[id number] Status: approved as of 04/01/22 Reason:As long as you remain covered by the [facility] and there are no changes to your plan benefits, this request is approved for the following time period: 04/01/2022 - 04/01/2025 Notified Pt via: Mychart

## 2022-04-20 ENCOUNTER — Other Ambulatory Visit: Payer: Self-pay | Admitting: Medical-Surgical

## 2022-05-13 ENCOUNTER — Ambulatory Visit (INDEPENDENT_AMBULATORY_CARE_PROVIDER_SITE_OTHER): Payer: BC Managed Care – PPO | Admitting: Medical-Surgical

## 2022-05-13 DIAGNOSIS — Z91199 Patient's noncompliance with other medical treatment and regimen due to unspecified reason: Secondary | ICD-10-CM

## 2022-05-13 DIAGNOSIS — Z23 Encounter for immunization: Secondary | ICD-10-CM

## 2022-05-13 NOTE — Progress Notes (Signed)
   Complete physical exam  Patient: Richard Cordova   DOB: 06/05/1998   46 y.o. Male  MRN: [id number]  Subjective:    No chief complaint on file.   Richard Cordova is a 46 y.o. male who presents today for a complete physical exam. She reports consuming a {diet types:17450} diet. {types:19826} She generally feels {DESC; WELL/FAIRLY WELL/POORLY:18703}. She reports sleeping {DESC; WELL/FAIRLY WELL/POORLY:18703}. She {does/does not:200015} have additional problems to discuss today.    Most recent fall risk assessment:    02/10/2022   10:42 AM  Fall Risk   Falls in the past year? 0  Number falls in past yr: 0  Injury with Fall? 0  Risk for fall due to : No Fall Risks  Follow up Falls evaluation completed     Most recent depression screenings:    02/10/2022   10:42 AM 01/01/2021   10:46 AM  PHQ 2/9 Scores  PHQ - 2 Score 0 0  PHQ- 9 Score 5     {VISON DENTAL STD PSA (Optional):27386}  {History (Optional):23778}  Patient Care Team: Rondal Vandevelde, NP as PCP - General (Nurse Practitioner)   Outpatient Medications Prior to Visit  Medication Sig   fluticasone (FLONASE) 50 MCG/ACT nasal spray Place 2 sprays into both nostrils in the morning and at bedtime. After 7 days, reduce to once daily.   norgestimate-ethinyl estradiol (SPRINTEC 28) 0.25-35 MG-MCG tablet Take 1 tablet by mouth daily.   Nystatin POWD Apply liberally to affected area 2 times per day   spironolactone (ALDACTONE) 100 MG tablet Take 1 tablet (100 mg total) by mouth daily.   No facility-administered medications prior to visit.    ROS        Objective:     There were no vitals taken for this visit. {Vitals History (Optional):23777}  Physical Exam   No results found for any visits on 03/18/22. {Show previous labs (optional):23779}    Assessment & Plan:    Routine Health Maintenance and Physical Exam  Immunization History  Administered Date(s) Administered   DTaP 08/19/1999, 10/15/1999,  12/24/1999, 09/08/2000, 03/24/2004   Hepatitis A 01/19/2008, 01/24/2009   Hepatitis B 06/06/1999, 07/14/1999, 12/24/1999   HiB (PRP-OMP) 08/19/1999, 10/15/1999, 12/24/1999, 09/08/2000   IPV 08/19/1999, 10/15/1999, 06/13/2000, 03/24/2004   Influenza,inj,Quad PF,6+ Mos 04/26/2014   Influenza-Unspecified 07/26/2012   MMR 06/13/2001, 03/24/2004   Meningococcal Polysaccharide 01/24/2012   Pneumococcal Conjugate-13 09/08/2000   Pneumococcal-Unspecified 12/24/1999, 03/08/2000   Tdap 01/24/2012   Varicella 06/13/2000, 01/19/2008    Health Maintenance  Topic Date Due   HIV Screening  Never done   Hepatitis C Screening  Never done   INFLUENZA VACCINE  03/16/2022   PAP-Cervical Cytology Screening  03/18/2022 (Originally 06/04/2020)   PAP SMEAR-Modifier  03/18/2022 (Originally 06/04/2020)   TETANUS/TDAP  03/18/2022 (Originally 01/23/2022)   HPV VACCINES  Discontinued   COVID-19 Vaccine  Discontinued    Discussed health benefits of physical activity, and encouraged her to engage in regular exercise appropriate for her age and condition.  Problem List Items Addressed This Visit   None Visit Diagnoses     Annual physical exam    -  Primary   Cervical cancer screening       Need for Tdap vaccination          No follow-ups on file.     Cassadi Purdie, NP   

## 2022-06-11 ENCOUNTER — Ambulatory Visit: Payer: BC Managed Care – PPO | Admitting: Medical-Surgical

## 2022-06-11 ENCOUNTER — Encounter: Payer: Self-pay | Admitting: Medical-Surgical

## 2022-06-11 VITALS — BP 120/74 | HR 94 | Resp 20 | Ht 70.0 in | Wt 340.1 lb

## 2022-06-11 DIAGNOSIS — M62838 Other muscle spasm: Secondary | ICD-10-CM

## 2022-06-11 DIAGNOSIS — B070 Plantar wart: Secondary | ICD-10-CM

## 2022-06-11 DIAGNOSIS — Z23 Encounter for immunization: Secondary | ICD-10-CM | POA: Diagnosis not present

## 2022-06-11 MED ORDER — CYCLOBENZAPRINE HCL 10 MG PO TABS
5.0000 mg | ORAL_TABLET | Freq: Three times a day (TID) | ORAL | 1 refills | Status: AC | PRN
Start: 2022-06-11 — End: ?

## 2022-06-11 NOTE — Progress Notes (Signed)
Established Patient Office Visit  Subjective   Patient ID: Richard Cordova, male   DOB: 1975/12/01 Age: 46 y.o. MRN: [id number]   Chief Complaint  Patient presents with   Obstructive Sleep Apnea    HPI Replacement 46 year old male presenting today for the following:  OSA: Has been using his CPAP as prescribed, doing very well with it.  Notes that it has been an adjustment.  Has been using it for the last 2 months and usually wears it greater than 4 hours each night.  Sometimes he averages 5 to 6 hours but he has only had 3 nights weekly at less than 4 hours of sleep.  He does have trouble falling asleep as he is a side sleeper and the mask will periodically break the seal which wakes him up and he is to start the process of falling asleep again.  Has an app that tracks his use and got a message recently that he had met the requirement for insurance to cover continued CPAP use.  Plantar warts: Successfully treated with cryotherapy at his last visit and has noted great improvement.  He does still have some warts that are present and would like to have them retreated today.  Reports that he is having a lot of bilateral hip pain along with some low back pain.  Feels this is related to poor hip and back mobility.  At times he feels like his muscles are stiff and he is having muscle spasms.  He is interested and a muscle relaxer to help on an as-needed basis while he works on increasing his hip mobility and overall strength through exercise.    Objective:    Vitals:   06/11/22 1604  BP: 120/74  Pulse: 94  Resp: 20  Height: _0  (1.778 m)  Weight: (!) 340 lb 1.6 oz (154.3 kg)  SpO2: 94%  BMI (Calculated): 48.8    Physical Exam Vitals reviewed.  Constitutional:      General: He is not in acute distress.    Appearance: Normal appearance. He is obese. He is not ill-appearing.  HENT:     Head: Normocephalic and atraumatic.  Cardiovascular:     Rate and Rhythm: Normal rate and regular  rhythm.     Pulses: Normal pulses.  Pulmonary:     Effort: Pulmonary effort is normal. No respiratory distress.  Skin:    General: Skin is warm and dry.     Findings: Lesion (Left plantar warts) present.  Neurological:     Mental Status: He is alert and oriented to person, place, and time.  Psychiatric:        Mood and Affect: Mood normal.        Behavior: Behavior normal.        Thought Content: Thought content normal.        Judgment: Judgment normal.   No results found for this or any previous visit (from the past 24 hour(s)).     The 10-year ASCVD risk score (Arnett DK, et al., 2019) is: 2.2%   Values used to calculate the score:     Age: 25 years     Sex: Male     Is Non-Hispanic African American: No     Diabetic: No     Tobacco smoker: No     Systolic Blood Pressure: 174 mmHg     Is BP treated: Yes     HDL Cholesterol: 50 mg/dL     Total Cholesterol: 203 mg/dL   Assessment &  Plan:   1. Need for influenza vaccination Flu vaccine given in office today. - Flu Vaccine QUAD 6+ mos PF IM (Fluarix Quad PF)  2. Plantar warts Procedure: Cryodestruction of: Left foot plantar warts Consent obtained and verified. Time-out conducted. Noted no overlying erythema, induration, or other signs of local infection. Completed without difficulty using Cryo-Gun. Advised to call if fevers/chills, erythema, induration, drainage, or persistent bleeding.   3. Muscle spasm Printed exercises for SI joint dysfunction and low back pain provided to patient with instructions to work on doing them regularly.  Advised to look into exercises that are designed specifically to increase hip and back mobility.  Sending in Flexeril 10 mg 3 times daily as needed but advised to avoid overuse of this medication since it can cause sedation.  Would like him to only use it for times that other measures do not help.  4.  Obstructive sleep apnea Overall use of the CPAP has been excellent in the last 2 months.   He is still trying to work on adjusting but is averaging greater than 4 hours per night.  He has seen good benefit from the use of the CPAP so far.  Advised that he should reach out to his CPAP supplier to see if they have an alternative mask that may be more beneficial for side sleeping.  Patient verbalized understanding is agreeable to the plan.  Return in about 3 months (around 09/11/2022) for ADHD follow up.  ___________________________________________ Clearnce Sorrel, DNP, APRN, FNP-BC Primary Care and [place]

## 2022-07-09 ENCOUNTER — Other Ambulatory Visit: Payer: Self-pay | Admitting: Medical-Surgical

## 2022-07-09 DIAGNOSIS — F909 Attention-deficit hyperactivity disorder, unspecified type: Secondary | ICD-10-CM

## 2022-07-12 ENCOUNTER — Other Ambulatory Visit: Payer: Self-pay | Admitting: Medical-Surgical

## 2022-07-12 DIAGNOSIS — F909 Attention-deficit hyperactivity disorder, unspecified type: Secondary | ICD-10-CM

## 2022-07-12 MED ORDER — AMPHETAMINE-DEXTROAMPHET ER 15 MG PO CP24: 15.0000 mg | ORAL_CAPSULE | ORAL | 0 refills | Status: DC

## 2022-07-12 MED ORDER — AMPHETAMINE-DEXTROAMPHET ER 15 MG PO CP24: 15.0000 mg | ORAL_CAPSULE | Freq: Every morning | ORAL | 0 refills | Status: DC

## 2022-07-12 NOTE — Telephone Encounter (Signed)
Last OV: 06/11/22 Next OV: no appt scheduled Last RF: 05/22/22

## 2022-07-19 LAB — HM COLONOSCOPY

## 2022-08-23 ENCOUNTER — Other Ambulatory Visit: Payer: Self-pay | Admitting: Medical-Surgical

## 2022-08-23 DIAGNOSIS — F909 Attention-deficit hyperactivity disorder, unspecified type: Secondary | ICD-10-CM

## 2022-08-23 NOTE — Telephone Encounter (Signed)
2 prescriptions already on file at his pharmacy dated 08/11/2022 and 09/10/2022.  He should contact his pharmacy to have this filled for pickup.

## 2022-09-01 ENCOUNTER — Other Ambulatory Visit: Payer: Self-pay | Admitting: Medical-Surgical

## 2022-09-01 DIAGNOSIS — F909 Attention-deficit hyperactivity disorder, unspecified type: Secondary | ICD-10-CM

## 2022-09-02 MED ORDER — AMPHETAMINE-DEXTROAMPHET ER 15 MG PO CP24: 15.0000 mg | ORAL_CAPSULE | ORAL | 0 refills | Status: DC

## 2022-09-02 NOTE — Telephone Encounter (Signed)
Last refill. Will need appointment for ADHD follow up before any further refills of Adderall. Nothing scheduled yet. Please contact patient to schedule follow up to prevent delays in medication refills.

## 2022-09-02 NOTE — Telephone Encounter (Signed)
Scheduled 10/15/2022 @10 :50. tvt

## 2022-10-15 ENCOUNTER — Ambulatory Visit: Payer: BC Managed Care – PPO | Admitting: Medical-Surgical

## 2022-10-15 ENCOUNTER — Encounter: Payer: Self-pay | Admitting: Medical-Surgical

## 2022-10-15 VITALS — BP 117/75 | HR 80 | Resp 20 | Ht 70.0 in | Wt 283.5 lb

## 2022-10-15 DIAGNOSIS — I10 Essential (primary) hypertension: Secondary | ICD-10-CM

## 2022-10-15 DIAGNOSIS — R7989 Other specified abnormal findings of blood chemistry: Secondary | ICD-10-CM | POA: Diagnosis not present

## 2022-10-15 DIAGNOSIS — F909 Attention-deficit hyperactivity disorder, unspecified type: Secondary | ICD-10-CM

## 2022-10-15 DIAGNOSIS — N521 Erectile dysfunction due to diseases classified elsewhere: Secondary | ICD-10-CM

## 2022-10-15 DIAGNOSIS — Z131 Encounter for screening for diabetes mellitus: Secondary | ICD-10-CM | POA: Diagnosis not present

## 2022-10-15 DIAGNOSIS — Z1329 Encounter for screening for other suspected endocrine disorder: Secondary | ICD-10-CM

## 2022-10-15 MED ORDER — TADALAFIL 5 MG PO TABS: 5.0000 mg | ORAL_TABLET | Freq: Every day | ORAL | 0 refills | Status: DC | PRN

## 2022-10-15 MED ORDER — "SYRINGE 18G X 1-1/2"" 3 ML MISC": 11 refills | Status: DC

## 2022-10-15 MED ORDER — TERBINAFINE HCL 250 MG PO TABS: 250.0000 mg | ORAL_TABLET | Freq: Every day | ORAL | 0 refills | Status: DC

## 2022-10-15 MED ORDER — "BD ECLIPSE SYRINGE/NEEDLE 23G X 1-1/2"" 3 ML MISC": 0 refills | Status: DC

## 2022-10-15 MED ORDER — AMPHETAMINE-DEXTROAMPHET ER 20 MG PO CP24: 20.0000 mg | ORAL_CAPSULE | ORAL | 0 refills | Status: DC

## 2022-10-15 NOTE — Progress Notes (Signed)
Established Patient Office Visit  Subjective   Patient ID: Richard Cordova, male   DOB: Jun 06, 1976 Age: 47 y.o. MRN: [id number]   Chief Complaint  Patient presents with   ADHD   Follow-up   HPI Pleasant 47 year old male presenting today for the following:  ADHD: Has been taking Adderall 15 mg XR daily, tolerating well without side effects.  Had some leftover instant release Adderall of 10 mg that he was taking in the afternoon to supplement but has run out of these.  Notes that the 15 mg in the morning is not sufficient to keep him focused throughout the day.  Interested in increasing the Adderall dose.  Has a history of low FSH/LH as well as low testosterone.  Previously did injections for testosterone replacement but his levels were variable.  Then switched to topical testosterone but did not like this route.  Is interested in restarting testosterone but would like to do smaller doses of injections on a more frequent basis to prevent the highs and lows that he was having before.  Has lost approximately 60 pounds in the last 5 to 6 months.  Unfortunately, when he loses weight he develops significant issues with jock itch.  He also has toenail fungus.  Allergic to multiple topical agents and is requesting oral treatment.  Hypertension: Reports that he has stopped taking the blood pressure medication since he started losing weight.  When he was taking it, he began to feel bad but has done well since he stopped it.   Objective:    Vitals:   10/15/22 1058  BP: 117/75  Pulse: 80  Resp: 20  Height: '5\' 10"'$  (1.778 m)  Weight: 283 lb 8 oz (128.6 kg)  SpO2: 97%  BMI (Calculated): 40.68   Physical Exam Vitals reviewed.  Constitutional:      General: He is not in acute distress.    Appearance: Normal appearance. He is obese. He is not ill-appearing.  HENT:     Head: Normocephalic and atraumatic.  Cardiovascular:     Rate and Rhythm: Normal rate and regular rhythm.     Pulses: Normal  pulses.     Heart sounds: Normal heart sounds. No murmur heard.    No friction rub. No gallop.  Pulmonary:     Effort: Pulmonary effort is normal. No respiratory distress.     Breath sounds: Normal breath sounds.  Skin:    General: Skin is warm and dry.     Findings: Rash present.  Neurological:     Mental Status: He is alert and oriented to person, place, and time.  Psychiatric:        Mood and Affect: Mood normal.        Behavior: Behavior normal.        Thought Content: Thought content normal.        Judgment: Judgment normal.   No results found for this or any previous visit (from the past 24 hour(s)).     The ASCVD Risk score (Arnett DK, et al., 2019) failed to calculate for the following reasons:   Cannot find a previous HDL lab   Cannot find a previous total cholesterol lab   Assessment & Plan:   1. Adult ADHD Increasing Adderall to 20 mg daily.  Advised patient to let me know if this works a bit better for him and we will go ahead and send the next 62-monthsupply. - amphetamine-dextroamphetamine (ADDERALL XR) 20 MG 24 hr capsule; Take 1 capsule (20 mg  total) by mouth every morning.  Dispense: 30 capsule; Refill: 0  2. Low serum follicle stimulating hormone (FSH) 3. Low serum luteinizing hormone (LH) Checking testosterone today.  Discussed testosterone replacement treatment.  Starting testosterone cypionate 100 mg every week to see if this keeps him better stabilized.  Advised him that he will need to have his testosterone rechecked between weeks 6 and 7 but on the middle day between his injection. - Testosterone  4. Hypertension goal BP (blood pressure) < 130/80 Checking labs as below.  Blood pressure is at goal despite stopping medication. - CBC with Differential/Platelet - COMPLETE METABOLIC PANEL WITH GFR - Lipid panel  5. Diabetes mellitus screening Checking hemoglobin A1c. - Hemoglobin A1c  6. Thyroid disorder screen Checking TSH. - TSH  7. Erectile  dysfunction due to diseases classified elsewhere Discontinue sildenafil.  Start tadalafil 5-10 mg daily.  Return in about 3 months (around 01/15/2023) for ADHD follow up.  ___________________________________________ Clearnce Sorrel, DNP, APRN, FNP-BC [facility] [employer] [place]

## 2022-10-16 LAB — TESTOSTERONE: Testosterone: 224 ng/dL — ABNORMAL LOW (ref 250–827)

## 2022-10-16 LAB — CBC WITH DIFFERENTIAL/PLATELET
Absolute Monocytes: 630 cells/uL (ref 200–950)
Basophils Absolute: 60 cells/uL (ref 0–200)
Basophils Relative: 0.6 %
Eosinophils Absolute: 200 cells/uL (ref 15–500)
Eosinophils Relative: 2 %
HCT: 47.6 % (ref 38.5–50.0)
Hemoglobin: 15.8 g/dL (ref 13.2–17.1)
Lymphs Abs: 1660 cells/uL (ref 850–3900)
MCH: 26.4 pg — ABNORMAL LOW (ref 27.0–33.0)
MCHC: 33.2 g/dL (ref 32.0–36.0)
MCV: 79.6 fL — ABNORMAL LOW (ref 80.0–100.0)
MPV: 11.9 fL (ref 7.5–12.5)
Monocytes Relative: 6.3 %
Neutro Abs: 7450 cells/uL (ref 1500–7800)
Neutrophils Relative %: 74.5 %
Platelets: 250 10*3/uL (ref 140–400)
RBC: 5.98 10*6/uL — ABNORMAL HIGH (ref 4.20–5.80)
RDW: 15.1 % — ABNORMAL HIGH (ref 11.0–15.0)
Total Lymphocyte: 16.6 %
WBC: 10 10*3/uL (ref 3.8–10.8)

## 2022-10-16 LAB — COMPLETE METABOLIC PANEL WITH GFR
AG Ratio: 1.6 (calc) (ref 1.0–2.5)
ALT: 30 U/L (ref 9–46)
AST: 21 U/L (ref 10–40)
Albumin: 4.7 g/dL (ref 3.6–5.1)
Alkaline phosphatase (APISO): 106 U/L (ref 36–130)
BUN: 14 mg/dL (ref 7–25)
CO2: 27 mmol/L (ref 20–32)
Calcium: 10 mg/dL (ref 8.6–10.3)
Chloride: 105 mmol/L (ref 98–110)
Creat: 0.83 mg/dL (ref 0.60–1.29)
Globulin: 2.9 g/dL (calc) (ref 1.9–3.7)
Glucose, Bld: 74 mg/dL (ref 65–99)
Potassium: 4.9 mmol/L (ref 3.5–5.3)
Sodium: 141 mmol/L (ref 135–146)
Total Bilirubin: 0.9 mg/dL (ref 0.2–1.2)
Total Protein: 7.6 g/dL (ref 6.1–8.1)
eGFR: 110 mL/min/{1.73_m2} (ref >=60)

## 2022-10-16 LAB — HEMOGLOBIN A1C
Hgb A1c MFr Bld: 5.2 % of total Hgb (ref <5.7)
Mean Plasma Glucose: 103 mg/dL
eAG (mmol/L): 5.7 mmol/L

## 2022-10-16 LAB — LIPID PANEL
Cholesterol: 199 mg/dL (ref <200)
HDL: 44 mg/dL (ref >=40)
LDL Cholesterol (Calc): 136 mg/dL (calc) — ABNORMAL HIGH
Non-HDL Cholesterol (Calc): 155 mg/dL (calc) — ABNORMAL HIGH (ref <130)
Total CHOL/HDL Ratio: 4.5 (calc) (ref <5.0)
Triglycerides: 86 mg/dL (ref <150)

## 2022-10-16 LAB — TSH: TSH: 1.28 mIU/L (ref 0.40–4.50)

## 2022-10-17 ENCOUNTER — Encounter: Payer: Self-pay | Admitting: Medical-Surgical

## 2022-10-18 ENCOUNTER — Other Ambulatory Visit: Payer: Self-pay | Admitting: Medical-Surgical

## 2022-10-18 MED ORDER — TESTOSTERONE CYPIONATE 200 MG/ML IM SOLN: 100.0000 mg | INTRAMUSCULAR | 2 refills | Status: DC

## 2022-10-21 ENCOUNTER — Telehealth: Payer: Self-pay

## 2022-10-21 NOTE — Telephone Encounter (Signed)
Initiated Prior authorization [id number] Cypionate '200MG'$ /ML intramuscular solution Via: Covermymeds Case/Key:[id number] Status: Pending as of 10/21/22 Reason: Notified Pt via: Mychart    Initiated Prior authorization for:Tadalafil '5MG'$  tablets Via: Covermymeds Case/Key:[id number] Status: Pending as of 10/21/22 Reason: Notified Pt via: Mychart   Initiated Prior authorization for: Amphetamine-Dextroamphet ER '20MG'$  er capsules Via: Covermymeds Case/Key:[id number] Status: Pending as of 10/21/22 Reason: Notified Pt via: Mychart

## 2022-11-05 ENCOUNTER — Encounter: Payer: Self-pay | Admitting: Medical-Surgical

## 2022-11-26 ENCOUNTER — Other Ambulatory Visit: Payer: Self-pay | Admitting: Medical-Surgical

## 2022-11-26 DIAGNOSIS — F909 Attention-deficit hyperactivity disorder, unspecified type: Secondary | ICD-10-CM

## 2022-11-26 MED ORDER — AMPHETAMINE-DEXTROAMPHET ER 20 MG PO CP24: 20.0000 mg | ORAL_CAPSULE | ORAL | 0 refills | Status: DC

## 2022-12-15 ENCOUNTER — Other Ambulatory Visit: Payer: Self-pay | Admitting: Medical-Surgical

## 2023-01-06 ENCOUNTER — Other Ambulatory Visit: Payer: Self-pay | Admitting: Medical-Surgical

## 2023-01-06 DIAGNOSIS — F909 Attention-deficit hyperactivity disorder, unspecified type: Secondary | ICD-10-CM

## 2023-01-07 MED ORDER — AMPHETAMINE-DEXTROAMPHET ER 20 MG PO CP24: 20.0000 mg | ORAL_CAPSULE | ORAL | 0 refills | Status: DC

## 2023-01-17 ENCOUNTER — Ambulatory Visit: Payer: BC Managed Care – PPO | Admitting: Medical-Surgical

## 2023-01-17 ENCOUNTER — Encounter: Payer: Self-pay | Admitting: Medical-Surgical

## 2023-01-17 VITALS — BP 127/71 | HR 65 | Resp 20 | Ht 70.0 in | Wt 297.3 lb

## 2023-01-17 DIAGNOSIS — E291 Testicular hypofunction: Secondary | ICD-10-CM

## 2023-01-17 DIAGNOSIS — K047 Periapical abscess without sinus: Secondary | ICD-10-CM | POA: Diagnosis not present

## 2023-01-17 DIAGNOSIS — Z1159 Encounter for screening for other viral diseases: Secondary | ICD-10-CM

## 2023-01-17 DIAGNOSIS — Z20828 Contact with and (suspected) exposure to other viral communicable diseases: Secondary | ICD-10-CM

## 2023-01-17 DIAGNOSIS — Z114 Encounter for screening for human immunodeficiency virus [HIV]: Secondary | ICD-10-CM

## 2023-01-17 DIAGNOSIS — F909 Attention-deficit hyperactivity disorder, unspecified type: Secondary | ICD-10-CM | POA: Diagnosis not present

## 2023-01-17 DIAGNOSIS — Z23 Encounter for immunization: Secondary | ICD-10-CM

## 2023-01-17 MED ORDER — AMPHETAMINE-DEXTROAMPHET ER 20 MG PO CP24
20.0000 mg | ORAL_CAPSULE | ORAL | 0 refills | Status: DC
Start: 2023-02-16 — End: 2023-04-26

## 2023-01-17 MED ORDER — AMOXICILLIN-POT CLAVULANATE 875-125 MG PO TABS: 1.0000 | ORAL_TABLET | Freq: Two times a day (BID) | ORAL | 0 refills | Status: DC

## 2023-01-17 MED ORDER — AMPHETAMINE-DEXTROAMPHET ER 20 MG PO CP24
20.0000 mg | ORAL_CAPSULE | ORAL | 0 refills | Status: DC
Start: 2023-01-17 — End: 2023-04-26

## 2023-01-17 MED ORDER — AMPHETAMINE-DEXTROAMPHET ER 20 MG PO CP24
20.0000 mg | ORAL_CAPSULE | ORAL | 0 refills | Status: DC
Start: 2023-03-18 — End: 2023-04-26

## 2023-01-17 NOTE — Progress Notes (Signed)
        Established patient visit  History, exam, impression, and plan:  1. Adult ADHD Pleasant 47 year old male presenting today for follow-up on ADHD.  He has been taking Adderall XR 20 mg daily, tolerating well without side effects.  Feels the medication is working well for him.  Takes his dose around 8 AM in the morning and feels this is effective until approximately 3-4 PM.  No difficulty with sleeping, unexpected weight changes, or appetite depression.  Denies palpitations, chest pain, shortness of breath, and constipation.  Continue Adderall XR 20 mg daily. - amphetamine-dextroamphetamine (ADDERALL XR) 20 MG 24 hr capsule; Take 1 capsule (20 mg total) by mouth every morning.  Dispense: 30 capsule; Refill: 0 - amphetamine-dextroamphetamine (ADDERALL XR) 20 MG 24 hr capsule; Take 1 capsule (20 mg total) by mouth every morning.  Dispense: 30 capsule; Refill: 0 - amphetamine-dextroamphetamine (ADDERALL XR) 20 MG 24 hr capsule; Take 1 capsule (20 mg total) by mouth every morning.  Dispense: 30 capsule; Refill: 0  2. Secondary male hypogonadism Has been self injecting testosterone 100 mg weekly, tolerating well without side effects.  Due for recheck of labs so ordering today. - Testosterone  3. Need for Tdap vaccination Tdap given in office today. - Tdap vaccine greater than or equal to 7yo IM  4. Need for hepatitis C screening test Discussed screening recommendations.  Adding to blood work today. - Hepatitis C Antibody  5. Encounter for screening for HIV Discussed screening recommendations.  Adding to blood work today. - HIV Antibody (routine testing w rflx)  6. Exposure to mononucleosis syndrome Notes his son was recently diagnosed with mono.  He reports he is not been feeling great lately as well and would like to be tested.  Checking antibody panel for EBV. - Epstein-Barr virus VCA antibody panel  7. Dental infection Has a known problem tooth in the left lower jaw that needs a  root canal.  Unfortunately, his dental insurance is not active and will not be for several months.  Over the last several days, he developed a pustule in the gum just below the problem tooth along with significant pain and swelling.  No notable discharge, fever, chills.  Treating with Augmentin twice daily x 7 days.  Procedures performed this visit: None.  Return in about 3 months (around 04/19/2023) for ADHD follow up.  __________________________________ Thayer Ohm, DNP, APRN, FNP-BC Primary Care and Sports Medicine [facility] [place]

## 2023-01-18 ENCOUNTER — Other Ambulatory Visit: Payer: Self-pay | Admitting: Medical-Surgical

## 2023-01-18 LAB — EPSTEIN-BARR VIRUS VCA ANTIBODY PANEL
EBV NA IgG: 49.9 U/mL — ABNORMAL HIGH
EBV VCA IgG: >750 U/mL — ABNORMAL HIGH
EBV VCA IgM: <36 U/mL

## 2023-01-18 LAB — TESTOSTERONE: Testosterone: 695 ng/dL (ref 250–827)

## 2023-01-18 LAB — HEPATITIS C ANTIBODY: Hepatitis C Ab: NONREACTIVE

## 2023-01-18 LAB — HIV ANTIBODY (ROUTINE TESTING W REFLEX): HIV 1&2 Ab, 4th Generation: NONREACTIVE

## 2023-01-19 ENCOUNTER — Telehealth: Payer: Self-pay | Admitting: Medical-Surgical

## 2023-01-19 ENCOUNTER — Other Ambulatory Visit: Payer: Self-pay | Admitting: Medical-Surgical

## 2023-01-19 MED ORDER — TESTOSTERONE CYPIONATE 200 MG/ML IM SOLN: 100.0000 mg | INTRAMUSCULAR | 2 refills | Status: DC

## 2023-01-19 NOTE — Telephone Encounter (Signed)
Refills sent

## 2023-01-19 NOTE — Telephone Encounter (Signed)
Pt checked w/pharmacy about refills on meds, was told that nothing was called in for testosterone(200mg /ml injection). He is asking for refill on this. Pharmacy on file is correct

## 2023-01-20 NOTE — Telephone Encounter (Signed)
Message sent to patient via Mycart regarding refill.

## 2023-02-28 ENCOUNTER — Encounter: Payer: Self-pay | Admitting: Medical-Surgical

## 2023-04-19 ENCOUNTER — Other Ambulatory Visit: Payer: Self-pay | Admitting: Medical-Surgical

## 2023-04-19 ENCOUNTER — Other Ambulatory Visit: Payer: Self-pay

## 2023-04-19 MED ORDER — TESTOSTERONE CYPIONATE 200 MG/ML IM SOLN: 100.0000 mg | INTRAMUSCULAR | 0 refills | Status: DC

## 2023-04-26 ENCOUNTER — Encounter: Payer: Self-pay | Admitting: Medical-Surgical

## 2023-04-26 ENCOUNTER — Ambulatory Visit: Payer: BC Managed Care – PPO | Admitting: Medical-Surgical

## 2023-04-26 VITALS — BP 139/84 | HR 66 | Resp 20 | Ht 70.0 in | Wt 314.0 lb

## 2023-04-26 DIAGNOSIS — Z23 Encounter for immunization: Secondary | ICD-10-CM

## 2023-04-26 DIAGNOSIS — E782 Mixed hyperlipidemia: Secondary | ICD-10-CM | POA: Diagnosis not present

## 2023-04-26 DIAGNOSIS — I10 Essential (primary) hypertension: Secondary | ICD-10-CM

## 2023-04-26 DIAGNOSIS — F909 Attention-deficit hyperactivity disorder, unspecified type: Secondary | ICD-10-CM

## 2023-04-26 DIAGNOSIS — E291 Testicular hypofunction: Secondary | ICD-10-CM | POA: Diagnosis not present

## 2023-04-26 DIAGNOSIS — Z6841 Body Mass Index (BMI) 40.0 and over, adult: Secondary | ICD-10-CM

## 2023-04-26 MED ORDER — TESTOSTERONE CYPIONATE 200 MG/ML IM SOLN
100.0000 mg | INTRAMUSCULAR | 2 refills | Status: DC
Start: 2023-04-26 — End: 2023-09-02

## 2023-04-26 MED ORDER — AMPHETAMINE-DEXTROAMPHETAMINE 20 MG PO TABS
20.0000 mg | ORAL_TABLET | Freq: Two times a day (BID) | ORAL | 0 refills | Status: DC
Start: 2023-04-26 — End: 2023-07-20

## 2023-04-26 NOTE — Progress Notes (Signed)
        Established patient visit  History, exam, impression, and plan:  1. Hypertension goal BP (blood pressure) < 130/80 Pleasant 47 year old male presenting today with a history of hypertension not currently on medications.  Not regularly checking blood pressure at home.  Was previously following a diet with a goal for weight loss but has not been consistent with this.  Denies concerning symptoms today.  Cardiopulmonary exam normal.  Blood pressure elevated on arrival at 138/74.  Recheck still mildly elevated. Patient endorses just drinking a lot of coffee before getting here.   Recommend returning to a low calorie, low sodium, heart healthy diet.  Also recommend exercise at least 3 times weekly with a goal for weight loss to a healthy weight.  2. Secondary male hypogonadism Doing well on testosterone injections.  Injecting 100 mg once weekly and most recent testosterone level looked great.  Overall feeling well and has no concerning side effects from testosterone use.  Continue testosterone 100 mg weekly and plan to recheck levels in 3 months. - testosterone cypionate (DEPOTESTOSTERONE CYPIONATE) 200 MG/ML injection; Inject 0.5 mLs (100 mg total) into the muscle once a week.  Dispense: 4 mL; Refill: 2  3. Class 3 severe obesity due to excess calories with serious comorbidity and body mass index (BMI) of 45.0 to 49.9 in adult [facility]) Discussed recommendations for weight loss/management.  Plans to get restarted on his previous diet and work on getting more activity and to aid with weight loss.  4. Mixed hyperlipidemia Not currently on any medications for management of hyperlipidemia.  His ASCVD risk was low so medication is not indicated for now.  Recommend working on diet/lifestyle modifications and weight loss.  5. Adult ADHD Has been taking Adderall XR 20 mg daily around 5:30 AM each day.  Unfortunately, the increased dose does not seem as effective since he started testosterone injections.   Now having issues with impulsive behavior and hypersexuality.  Has tried Vyvanse in the past which was not well-tolerated.  Previously used extended release in the morning with a small afternoon dose but does not feel that this was optimal.  Is interested in other options.  After discussion, plan to switch to Adderall 20 mg instant release twice daily.   - amphetamine-dextroamphetamine (ADDERALL) 20 MG tablet; Take 1 tablet (20 mg total) by mouth 2 (two) times daily.  Dispense: 60 tablet; Refill: 0  6. Need for influenza vaccination Flu vaccine given in office today. - Flu vaccine trivalent PF, 6mos and older(Flulaval,Afluria,Fluarix,Fluzone)   Procedures performed this visit: None.  Return in about 3 months (around 07/26/2023) for ADHD/testosterone follow up.  __________________________________ Thayer Ohm, DNP, APRN, FNP-BC Primary Care and Sports Medicine [facility] [place]

## 2023-05-04 ENCOUNTER — Telehealth: Payer: Self-pay | Admitting: Family Medicine

## 2023-05-04 NOTE — Telephone Encounter (Signed)
Patient is calling for a follow up on his PA for adderall 20 mg twice a day Please advise  [facility] [address] [place] [address] [phone]

## 2023-05-10 ENCOUNTER — Encounter: Payer: Self-pay | Admitting: Medical-Surgical

## 2023-05-24 ENCOUNTER — Encounter: Payer: Self-pay | Admitting: Medical-Surgical

## 2023-05-30 ENCOUNTER — Other Ambulatory Visit: Payer: Self-pay | Admitting: Medical-Surgical

## 2023-05-30 DIAGNOSIS — E291 Testicular hypofunction: Secondary | ICD-10-CM

## 2023-05-31 ENCOUNTER — Telehealth: Payer: Self-pay | Admitting: Medical-Surgical

## 2023-05-31 NOTE — Telephone Encounter (Signed)
Patient called he is asking for a follow up on PA for his Adderall 20mg  twice a day he hasn't heard anything and wants to get a response on the status please  [facility] [address] [place] [address] Phone [phone]

## 2023-06-01 ENCOUNTER — Telehealth: Payer: Self-pay

## 2023-06-01 NOTE — Telephone Encounter (Addendum)
Initiated Prior authorization ZOX:[id number] 20MG  tablets Via: Covermymeds Case/Key:[id number] Status: approved  as of 06/01/23 Reason:06/02/23-06/01/2026 Notified Pt via: Mychart

## 2023-06-02 ENCOUNTER — Other Ambulatory Visit: Payer: Self-pay | Admitting: Medical-Surgical

## 2023-07-20 ENCOUNTER — Other Ambulatory Visit: Payer: Self-pay | Admitting: Medical-Surgical

## 2023-07-20 DIAGNOSIS — F909 Attention-deficit hyperactivity disorder, unspecified type: Secondary | ICD-10-CM

## 2023-07-21 MED ORDER — TADALAFIL 5 MG PO TABS: 5.0000 mg | ORAL_TABLET | Freq: Every day | ORAL | 0 refills | Status: DC | PRN

## 2023-07-21 MED ORDER — AMPHETAMINE-DEXTROAMPHETAMINE 20 MG PO TABS
20.0000 mg | ORAL_TABLET | Freq: Two times a day (BID) | ORAL | 0 refills | Status: DC
Start: 2023-07-21 — End: 2023-09-02

## 2023-07-26 ENCOUNTER — Ambulatory Visit: Payer: BC Managed Care – PPO | Admitting: Medical-Surgical

## 2023-08-25 ENCOUNTER — Ambulatory Visit: Payer: BC Managed Care – PPO | Admitting: Medical-Surgical

## 2023-09-02 ENCOUNTER — Other Ambulatory Visit: Payer: Self-pay | Admitting: Medical-Surgical

## 2023-09-02 ENCOUNTER — Ambulatory Visit (INDEPENDENT_AMBULATORY_CARE_PROVIDER_SITE_OTHER): Payer: 59 | Admitting: Medical-Surgical

## 2023-09-02 ENCOUNTER — Encounter: Payer: Self-pay | Admitting: Medical-Surgical

## 2023-09-02 VITALS — BP 133/85 | HR 81 | Resp 20 | Ht 70.0 in | Wt 331.5 lb

## 2023-09-02 DIAGNOSIS — E291 Testicular hypofunction: Secondary | ICD-10-CM | POA: Diagnosis not present

## 2023-09-02 DIAGNOSIS — F900 Attention-deficit hyperactivity disorder, predominantly inattentive type: Secondary | ICD-10-CM

## 2023-09-02 DIAGNOSIS — L723 Sebaceous cyst: Secondary | ICD-10-CM

## 2023-09-02 DIAGNOSIS — B070 Plantar wart: Secondary | ICD-10-CM

## 2023-09-02 DIAGNOSIS — I10 Essential (primary) hypertension: Secondary | ICD-10-CM

## 2023-09-02 DIAGNOSIS — F909 Attention-deficit hyperactivity disorder, unspecified type: Secondary | ICD-10-CM

## 2023-09-02 DIAGNOSIS — R0602 Shortness of breath: Secondary | ICD-10-CM

## 2023-09-02 MED ORDER — TESTOSTERONE CYPIONATE 200 MG/ML IM SOLN: 100.0000 mg | INTRAMUSCULAR | 2 refills | Status: DC

## 2023-09-02 MED ORDER — AMPHETAMINE-DEXTROAMPHETAMINE 20 MG PO TABS
20.0000 mg | ORAL_TABLET | Freq: Two times a day (BID) | ORAL | 0 refills | Status: DC
Start: 2023-11-01 — End: 2023-12-19

## 2023-09-02 MED ORDER — AIRSUPRA 90-80 MCG/ACT IN AERO: 2.0000 | INHALATION_SPRAY | Freq: Four times a day (QID) | RESPIRATORY_TRACT | 11 refills | Status: AC | PRN

## 2023-09-02 MED ORDER — AMPHETAMINE-DEXTROAMPHETAMINE 20 MG PO TABS
20.0000 mg | ORAL_TABLET | Freq: Two times a day (BID) | ORAL | 0 refills | Status: DC
Start: 2023-10-02 — End: 2023-12-19

## 2023-09-02 MED ORDER — "BD ECLIPSE SYRINGE/NEEDLE 23G X 1-1/2"" 3 ML MISC": 0 refills | Status: AC

## 2023-09-02 MED ORDER — LISINOPRIL 10 MG PO TABS: 10.0000 mg | ORAL_TABLET | Freq: Every day | ORAL | 3 refills | Status: AC

## 2023-09-02 MED ORDER — "SYRINGE 18G X 1-1/2"" 3 ML MISC": 11 refills | Status: AC

## 2023-09-02 MED ORDER — ALBUTEROL SULFATE HFA 108 (90 BASE) MCG/ACT IN AERS: 2.0000 | INHALATION_SPRAY | Freq: Four times a day (QID) | RESPIRATORY_TRACT | 5 refills | Status: DC | PRN

## 2023-09-02 MED ORDER — AMPHETAMINE-DEXTROAMPHETAMINE 20 MG PO TABS
20.0000 mg | ORAL_TABLET | Freq: Two times a day (BID) | ORAL | 0 refills | Status: DC
Start: 2023-09-02 — End: 2023-12-19

## 2023-09-02 NOTE — Progress Notes (Signed)
Established patient visit  History, exam, impression, and plan:  1. Hypertension goal BP (blood pressure) < 130/80 (Primary) Pleasant 48 year old male presenting today with a history of hypertension not currently managed with medications.  His blood pressure is elevated on arrival and has been hovering just over goal for a while.  He has decided he should go ahead and restart medications.  Previously took lisinopril 10 mg daily, tolerated well without side effects.  Plan to restart lisinopril.  Checking labs today.  No concerning symptoms and cardiopulmonary exam was benign. - lisinopril (ZESTRIL) 10 MG tablet; Take 1 tablet (10 mg total) by mouth daily.  Dispense: 90 tablet; Refill: 3 - CBC - CMP14+EGFR - Lipid panel  2. Adult ADHD Has been taking Adderall 20 mg twice daily, tolerating well with no side effects.  Feels that this has been working better than the extended release did and he is getting more benefit.  No side effects, appetite fluctuations, weight changes, or interruptions in sleeping pattern.  Continue Adderall 20 mg twice daily. - amphetamine-dextroamphetamine (ADDERALL) 20 MG tablet; Take 1 tablet (20 mg total) by mouth 2 (two) times daily.  Dispense: 60 tablet; Refill: 0 - amphetamine-dextroamphetamine (ADDERALL) 20 MG tablet; Take 1 tablet (20 mg total) by mouth 2 (two) times daily.  Dispense: 60 tablet; Refill: 0 - amphetamine-dextroamphetamine (ADDERALL) 20 MG tablet; Take 1 tablet (20 mg total) by mouth 2 (two) times daily.  Dispense: 60 tablet; Refill: 0  4. Secondary male hypogonadism Currently injecting testosterone cypionate 100 mg weekly, tolerating well without side effects.  Due for recheck of labs today.  His last injection was Sunday so we will place the order and he will come back on the middle day between injections for the most accurate results.  Refilling syringes and needles per patient request.  Refilling testosterone.  Patient aware that dosing may be  adjusted depending on lab results. - testosterone cypionate (DEPOTESTOSTERONE CYPIONATE) 200 MG/ML injection; Inject 0.5 mLs (100 mg total) into the muscle once a week.  Dispense: 4 mL; Refill: 2 - CBC - CMP14+EGFR - Testosterone - Lipid panel  5. Shortness of breath Over the last 2 months has been experiencing some shortness of breath with intermittent wheezing.  Has an albuterol inhaler however does not have it with him.  No fever, chills, chest pain, or other viral symptoms.  Cough is nonproductive.  On exam, respirations even and unlabored.  Lungs clear to auscultation throughout all lung fields.  Switching to Airsupra to see if this is more beneficial than albuterol. - Albuterol-Budesonide (AIRSUPRA) 90-80 MCG/ACT AERO; Inhale 2 puffs into the lungs every 6 (six) hours as needed.  Dispense: 1 g; Refill: 11  6. Sebaceous cyst Has a small lump along the left lower jawline that has been there for several weeks.  Originally started as a bump similar to others he has in the area around the hair follicles.  Unfortunately, it did not fully resolve.  The area is tender at times but only with hard pressure.  No drainage, redness, fluctuance, or swelling noted.  On exam, a subcentimeter round movable lump is palpable.  Suspect this is a sebaceous cyst.  Discussed the benign nature and recommendations for monitoring.  7. Plantar warts Reports that previous treatment for plantar warts helped to reduce the size just a bit however they are still present and bothersome.  He would like to have them retreated with cryotherapy today.  See below for procedure note.  Procedures performed this visit: Cryotherapy template Procedure: Cryodestruction of: multiple left foot plantar warts (#6) Consent obtained and verified. Time-out conducted. Noted no overlying erythema, induration, or other signs of local infection. Completed without difficulty using Cryo-Gun. Advised to call if fevers/chills, erythema,  induration, drainage, or persistent bleeding.  Return in about 2 weeks (around 09/16/2023) for nurse visit for BP check.  __________________________________ Thayer Ohm, DNP, APRN, FNP-BC Primary Care and Sports Medicine [facility] [place]

## 2023-09-22 ENCOUNTER — Ambulatory Visit: Payer: 59

## 2023-10-05 ENCOUNTER — Ambulatory Visit (INDEPENDENT_AMBULATORY_CARE_PROVIDER_SITE_OTHER): Payer: 59 | Admitting: Medical-Surgical

## 2023-10-05 VITALS — BP 121/75 | HR 85 | Temp 98.1°F | Ht 70.0 in | Wt 329.1 lb

## 2023-10-05 DIAGNOSIS — I10 Essential (primary) hypertension: Secondary | ICD-10-CM

## 2023-10-05 NOTE — Progress Notes (Signed)
Patient is here for blood pressure check. Denies trouble sleeping, palpitations, dizziness, lightheadedness, blurry vision, chest pain, shortness of breath, headaches and/or medication problems.   Per patient, he is taking Lisinopril 10 mg daily. Patient's blood pressure was within goal range 121/75, pulse 85. Patient completed lab work today.   Patient informed to schedule next appointment in 3 months .

## 2023-10-06 ENCOUNTER — Encounter: Payer: Self-pay | Admitting: Medical-Surgical

## 2023-10-06 LAB — LIPID PANEL
Chol/HDL Ratio: 5.3 {ratio} — ABNORMAL HIGH (ref 0.0–5.0)
Cholesterol, Total: 201 mg/dL — ABNORMAL HIGH (ref 100–199)
HDL: 38 mg/dL — ABNORMAL LOW (ref >39)
LDL Chol Calc (NIH): 145 mg/dL — ABNORMAL HIGH (ref 0–99)
Triglycerides: 100 mg/dL (ref 0–149)
VLDL Cholesterol Cal: 18 mg/dL (ref 5–40)

## 2023-10-06 LAB — CMP14+EGFR
ALT: 26 [IU]/L (ref 0–44)
AST: 21 [IU]/L (ref 0–40)
Albumin: 4.3 g/dL (ref 4.1–5.1)
Alkaline Phosphatase: 74 [IU]/L (ref 44–121)
BUN/Creatinine Ratio: 11 (ref 9–20)
BUN: 13 mg/dL (ref 6–24)
Bilirubin Total: 0.9 mg/dL (ref 0.0–1.2)
CO2: 25 mmol/L (ref 20–29)
Calcium: 9.7 mg/dL (ref 8.7–10.2)
Chloride: 100 mmol/L (ref 96–106)
Creatinine, Ser: 1.18 mg/dL (ref 0.76–1.27)
Globulin, Total: 2.8 g/dL (ref 1.5–4.5)
Glucose: 86 mg/dL (ref 70–99)
Potassium: 4.7 mmol/L (ref 3.5–5.2)
Sodium: 140 mmol/L (ref 134–144)
Total Protein: 7.1 g/dL (ref 6.0–8.5)
eGFR: 77 mL/min/{1.73_m2} (ref >59)

## 2023-10-06 LAB — CBC
Hematocrit: 54.3 % — ABNORMAL HIGH (ref 37.5–51.0)
Hemoglobin: 17.4 g/dL (ref 13.0–17.7)
MCH: 25.4 pg — ABNORMAL LOW (ref 26.6–33.0)
MCHC: 32 g/dL (ref 31.5–35.7)
MCV: 79 fL (ref 79–97)
Platelets: 266 10*3/uL (ref 150–450)
RBC: 6.86 x10E6/uL — ABNORMAL HIGH (ref 4.14–5.80)
RDW: 14.7 % (ref 11.6–15.4)
WBC: 12.2 10*3/uL — ABNORMAL HIGH (ref 3.4–10.8)

## 2023-10-06 LAB — TESTOSTERONE: Testosterone: 944 ng/dL — ABNORMAL HIGH (ref 264–916)

## 2023-11-09 ENCOUNTER — Other Ambulatory Visit: Payer: Self-pay | Admitting: Medical-Surgical

## 2023-12-09 ENCOUNTER — Other Ambulatory Visit: Payer: Self-pay | Admitting: Medical-Surgical

## 2023-12-09 DIAGNOSIS — E291 Testicular hypofunction: Secondary | ICD-10-CM

## 2023-12-19 ENCOUNTER — Other Ambulatory Visit: Payer: Self-pay | Admitting: Medical-Surgical

## 2023-12-19 DIAGNOSIS — F909 Attention-deficit hyperactivity disorder, unspecified type: Secondary | ICD-10-CM

## 2023-12-19 MED ORDER — AMPHETAMINE-DEXTROAMPHETAMINE 20 MG PO TABS: 20.0000 mg | ORAL_TABLET | Freq: Two times a day (BID) | ORAL | 0 refills | Status: DC

## 2023-12-19 MED ORDER — AMPHETAMINE-DEXTROAMPHETAMINE 20 MG PO TABS
20.0000 mg | ORAL_TABLET | Freq: Two times a day (BID) | ORAL | 0 refills | Status: DC
Start: 2024-01-18 — End: 2024-04-04

## 2023-12-19 NOTE — Telephone Encounter (Signed)
 Requesting adderall 20mg   Last written 11/01/2023 Last OV 09/02/2023 Upcoming appt = none

## 2023-12-22 ENCOUNTER — Ambulatory Visit: Admitting: Family Medicine

## 2023-12-22 ENCOUNTER — Encounter: Payer: Self-pay | Admitting: Family Medicine

## 2023-12-22 ENCOUNTER — Ambulatory Visit

## 2023-12-22 VITALS — BP 134/83 | HR 101 | Temp 98.2°F | Ht 70.0 in | Wt 335.0 lb

## 2023-12-22 DIAGNOSIS — R059 Cough, unspecified: Secondary | ICD-10-CM

## 2023-12-22 DIAGNOSIS — J4521 Mild intermittent asthma with (acute) exacerbation: Secondary | ICD-10-CM | POA: Diagnosis not present

## 2023-12-22 DIAGNOSIS — R0602 Shortness of breath: Secondary | ICD-10-CM | POA: Diagnosis not present

## 2023-12-22 MED ORDER — HYDROCOD POLI-CHLORPHE POLI ER 10-8 MG/5ML PO SUER: 5.0000 mL | Freq: Two times a day (BID) | ORAL | 0 refills | Status: DC | PRN

## 2023-12-22 MED ORDER — METHYLPREDNISOLONE SODIUM SUCC 125 MG IJ SOLR
125.0000 mg | Freq: Once | INTRAMUSCULAR | Status: AC
  Administered 2023-12-22: 125 mg via INTRAMUSCULAR

## 2023-12-22 MED ORDER — PREDNISONE 20 MG PO TABS
20.0000 mg | ORAL_TABLET | Freq: Two times a day (BID) | ORAL | 0 refills | Status: AC
Start: 2023-12-22 — End: 2023-12-27

## 2023-12-22 MED ORDER — DOXYCYCLINE HYCLATE 100 MG PO TABS: 100.0000 mg | ORAL_TABLET | Freq: Two times a day (BID) | ORAL | 0 refills | Status: DC

## 2023-12-22 NOTE — Assessment & Plan Note (Signed)
 Cough with wheezing x3-4 week.  Diffuse wheezing on exam.  Solu-medrol  125mg  given in clinic today.  Will continue with additional 5 day prednisone  burst.  Add doxycycline  as well.  Tussionex as needed for cough.  Continue albuterol  prn.  2 View cxr ordered given prolonged symptoms.

## 2023-12-22 NOTE — Progress Notes (Signed)
 Richard Cordova - 48 y.o. male MRN [id number]  Date of birth: 1975/09/05  Subjective Chief Complaint  Patient presents with   URI    HPI Richard Cordova is a 48 y.o. male with history of Asthma here today with complaint of 3-4 week history of cough, chest congestion and wheezing.  He has required rescue inhaler more frequently.  He continues to have wheezing and some mild shortness of breath.  Cough is semi-productive with thick, tan mucus. He has not had fever or chills.  He does have trouble sleeping due to cough.  OTC medication have not really helped him.   ROS:  A comprehensive ROS was completed and negative except as noted per HPI  Allergies  Allergen Reactions   Coconut Fatty Acid Shortness Of Breath   Escitalopram  Other (See Comments)    Low libido, ED    Past Medical History:  Diagnosis Date   ADHD (attention deficit hyperactivity disorder), inattentive type 03/07/2014   Dr. Adell Age - Started on Vyvanse 20mg  July 2015, additional 10mg  in the afternoon Aug 2015, Switched to Concerta Nov 2015 caused low libido, changed to Adderall XR 20mg  February 2016    Family history of prostate cancer in father 09/21/2017   prostate, renal and testicular cancer in father        Fatigue 07/24/2013   Hyperlipidemia 08/09/2013   Lipitor started June 2015    Hypotestosteronism 07/26/2013   Dose lowered June 2015, Labs Due Jan 2016    MRSA infection 07/24/2013   Obesity 07/24/2013   Secondary male hypogonadism 03/09/2017   Low FSH and LH 02/2017   Suspect osteomyelitis of fifth toe of left foot (HCC) 10/04/2017    Past Surgical History:  Procedure Laterality Date   VASECTOMY  03/2012    Social History   Socioeconomic History   Marital status: Married    Spouse name: Not on file   Number of children: Not on file   Years of education: Not on file   Highest education level: Not on file  Occupational History   Not on file  Tobacco Use   Smoking status: Never   Smokeless tobacco: Never   Vaping Use   Vaping status: Never Used  Substance and Sexual Activity   Alcohol use: No   Drug use: No   Sexual activity: Yes  Other Topics Concern   Not on file  Social History Narrative   Not on file   Social Drivers of Health   Financial Resource Strain: Low Risk  (12/20/2021)   Received from [facility], [facility]   Overall Financial Resource Strain (CARDIA)    Difficulty of Paying Living Expenses: Not very hard  Food Insecurity: No Food Insecurity (12/31/2021)   Received from [facility], [facility]   Hunger Vital Sign    Worried About Running Out of Food in the Last Year: Never true    Ran Out of Food in the Last Year: Never true  Transportation Needs: Not on file  Physical Activity: Not on file  Stress: No Stress Concern Present (12/20/2021)   Received from [facility], [facility]   [employer] of Occupational Health - Occupational Stress Questionnaire    Feeling of Stress : Not at all  Social Connections: Unknown (12/19/2021)   Received from [facility], [facility]   Social Network    Social Network: Not on file    Family History  Problem Relation Age of Onset   Cancer Father  testicular   Alcoholism Father    Testicular cancer Father    Sleep apnea Father    Depression Father    Prostate cancer Father    Renal cancer Father    Alcoholism Mother    Depression Mother    Heart attack Paternal Uncle     Health Maintenance  Topic Date Due   COVID-19 Vaccine (3 - Pfizer risk series) 12/18/2019   INFLUENZA VACCINE  03/16/2024   Colonoscopy  07/19/2032   DTaP/Tdap/Td (3 - Td or Tdap) 01/16/2033   Pneumococcal Vaccine 23-30 Years old  Completed   Hepatitis C Screening  Completed   HIV Screening  Completed   HPV VACCINES  Aged Out   Meningococcal B Vaccine  Aged Out      ----------------------------------------------------------------------------------------------------------------------------------------------------------------------------------------------------------------- Physical Exam BP 134/83 (BP Location: Left Arm, Patient Position: Sitting, Cuff Size: Large)   Pulse (!) 101   Temp 98.2 F (36.8 C) (Oral)   Ht 5\' 10"  (1.778 m)   Wt (!) 335 lb (152 kg)   SpO2 96%   BMI 48.07 kg/m   Physical Exam Constitutional:      Appearance: Normal appearance.  HENT:     Head: Normocephalic and atraumatic.     Right Ear: Tympanic membrane normal.     Left Ear: Tympanic membrane normal.  Eyes:     General: No scleral icterus. Cardiovascular:     Rate and Rhythm: Normal rate and regular rhythm.  Pulmonary:     Effort: Pulmonary effort is normal.     Breath sounds: Wheezing (diffuse) present.  Musculoskeletal:     Cervical back: Neck supple.  Neurological:     Mental Status: He is alert.  Psychiatric:        Mood and Affect: Mood normal.        Behavior: Behavior normal.     ------------------------------------------------------------------------------------------------------------------------------------------------------------------------------------------------------------------- Assessment and Plan  Mild intermittent asthma with exacerbation Cough with wheezing x3-4 week.  Diffuse wheezing on exam.  Solu-medrol  125mg  given in clinic today.  Will continue with additional 5 day prednisone  burst.  Add doxycycline  as well.  Tussionex as needed for cough.  Continue albuterol  prn.  2 View cxr ordered given prolonged symptoms.    Meds ordered this encounter  Medications   predniSONE  (DELTASONE ) 20 MG tablet    Sig: Take 1 tablet (20 mg total) by mouth 2 (two) times daily with a meal for 5 days.    Dispense:  10 tablet    Refill:  0   doxycycline  (VIBRA -TABS) 100 MG tablet    Sig: Take 1 tablet (100 mg total) by mouth 2 (two) times daily.     Dispense:  20 tablet    Refill:  0   chlorpheniramine-HYDROcodone  (TUSSIONEX) 10-8 MG/5ML    Sig: Take 5 mLs by mouth every 12 (twelve) hours as needed for cough.    Dispense:  110 mL    Refill:  0   methylPREDNISolone  sodium succinate (SOLU-MEDROL ) 125 mg/2 mL injection 125 mg    No follow-ups on file.

## 2023-12-23 ENCOUNTER — Encounter: Payer: Self-pay | Admitting: Family Medicine

## 2024-03-16 NOTE — Procedures (Signed)
Result scanned to media

## 2024-03-20 ENCOUNTER — Other Ambulatory Visit: Payer: Self-pay | Admitting: Medical-Surgical

## 2024-03-20 DIAGNOSIS — E291 Testicular hypofunction: Secondary | ICD-10-CM

## 2024-03-21 NOTE — Telephone Encounter (Signed)
Pt informed, thanks

## 2024-03-21 NOTE — Telephone Encounter (Signed)
 Yes.  I sent in the refill this morning.

## 2024-04-04 NOTE — Progress Notes (Unsigned)
 Established patient visit   History of Present Illness   Discussed the use of AI scribe software for clinical note transcription with the patient, who gave verbal consent to proceed.  History of Present Illness   Richard Cordova is a 48 year old male who presents with concerns about his current medication regimen and symptoms of fatigue.  Fatigue and hypoglycemic symptoms - General exhaustion, particularly on weekends, leading to increased sleep - Significant 'sugar crashes' after consuming sugary foods, with increased intake on weekends  Attention deficit hyperactivity disorder and stimulant side effects - Currently taking Adderall 20 mg BID prn for ADHD - Adderall use for five to six years - Adverse effects include chest tightness, palpitations, increased anxiety, and sleep disturbances - Previous trials of Vyvanse and Concerta resulted in undesirable side effects - Open to alternatives to Adderall   Recurrent herpetic labialis - Recent cold sore outbreak, first in six to eight years - Requests prescription for Valtrex   Digital pain and history of infection - History of severe infection in pinky toe with near bone involvement - Recent increase in pain in the same toe without signs of infection - Ongoing issues with plantar warts and foot fungus - Interest in podiatry referral  Testosterone  replacement therapy - Administers slightly less than testosterone  100 mg intramuscularly once weekly - Last injection on Sunday - Recent dose adjustment due to previously elevated testosterone  levels  Obesity and Obstructive sleep apnea - Diagnosed with sleep apnea two years ago - Interested in exploring treatment options that may also help with weight reduction - Considering starting a weight loss program  Erectile dysfunction and medication side effects - Uses Cialis  for erectile dysfunction, typically two tablets per occasion, three to four times per month - Experiences  nasal congestion as a side effect       Physical Exam   Physical Exam Vitals and nursing note reviewed.  Constitutional:      General: He is not in acute distress.    Appearance: Normal appearance. He is obese. He is not ill-appearing.  HENT:     Head: Normocephalic and atraumatic.  Cardiovascular:     Rate and Rhythm: Normal rate and regular rhythm.     Pulses: Normal pulses.     Heart sounds: Normal heart sounds. No murmur heard.    No friction rub. No gallop.  Pulmonary:     Effort: Pulmonary effort is normal. No respiratory distress.     Breath sounds: Normal breath sounds.  Skin:    General: Skin is warm and dry.  Neurological:     Mental Status: He is alert and oriented to person, place, and time.  Psychiatric:        Mood and Affect: Mood normal.        Behavior: Behavior normal.        Thought Content: Thought content normal.        Judgment: Judgment normal.    Assessment & Plan   Assessment and Plan    Attention-deficit hyperactivity disorder (ADHD) ADHD managed with Adderall 20 mg BID. Experiencing side effects and concerns about medication reliance. Open to alternative medications. - Refill Adderall 20mg  twice daily for one month. - Send list of alternative ADHD medications for review. - Consider trial of alternative medication if desired.  Obesity and Obstructive sleep apnea Obesity with unsuccessful weight loss attempts. Considering Zepbound for weight loss and sleep apnea. Insurance coverage uncertain. - Attempt to obtain insurance coverage for Zepbound  under sleep apnea indication.  Testosterone  deficiency Testosterone  deficiency managed with weekly intramuscular injections. Previous levels slightly elevated, dose reduced. - Order testosterone  level check. - Add estradiol  test to blood work.  Erectile dysfunction Erectile dysfunction managed with Cialis . Experiences nasal congestion as a side effect. - Refill Cialis  10 mg tablets.  Type 2  diabetes mellitus well controlled. Experiences sugar crashes after consuming sugar. - Order A1c test, results 5.1%.  Recurrent herpes labialis (cold sores) Recurrent herpes labialis with recent outbreak. Previously managed with Valtrex . - Prescribe Valtrex  prn for cold sores.  Plantar warts and tinea pedis Plantar warts and tinea pedis with ongoing issues. Experiencing pain, no current infection signs. - Refer to podiatrist for evaluation and management of plantar warts and tinea pedis.     Follow up   Return in about 6 months (around 10/06/2024) for chronic disease follow up. __________________________________ Zada FREDRIK Palin, DNP, APRN, FNP-BC Primary Care and Sports Medicine [facility] [place]

## 2024-04-05 ENCOUNTER — Ambulatory Visit: Admitting: Medical-Surgical

## 2024-04-05 ENCOUNTER — Encounter: Payer: Self-pay | Admitting: Medical-Surgical

## 2024-04-05 VITALS — BP 114/73 | HR 64 | Resp 20 | Ht 70.0 in | Wt 331.0 lb

## 2024-04-05 DIAGNOSIS — F900 Attention-deficit hyperactivity disorder, predominantly inattentive type: Secondary | ICD-10-CM | POA: Diagnosis not present

## 2024-04-05 DIAGNOSIS — B001 Herpesviral vesicular dermatitis: Secondary | ICD-10-CM | POA: Insufficient documentation

## 2024-04-05 DIAGNOSIS — I10 Essential (primary) hypertension: Secondary | ICD-10-CM

## 2024-04-05 DIAGNOSIS — E291 Testicular hypofunction: Secondary | ICD-10-CM

## 2024-04-05 DIAGNOSIS — B070 Plantar wart: Secondary | ICD-10-CM | POA: Insufficient documentation

## 2024-04-05 DIAGNOSIS — E782 Mixed hyperlipidemia: Secondary | ICD-10-CM

## 2024-04-05 DIAGNOSIS — M79675 Pain in left toe(s): Secondary | ICD-10-CM | POA: Insufficient documentation

## 2024-04-05 LAB — POCT GLYCOSYLATED HEMOGLOBIN (HGB A1C): Hemoglobin A1C: 5.1 % (ref 4.0–5.6)

## 2024-04-05 MED ORDER — AMPHETAMINE-DEXTROAMPHETAMINE 20 MG PO TABS: 20.0000 mg | ORAL_TABLET | Freq: Two times a day (BID) | ORAL | 0 refills | Status: DC

## 2024-04-05 MED ORDER — TESTOSTERONE CYPIONATE 200 MG/ML IM SOLN: 100.0000 mg | INTRAMUSCULAR | 0 refills | Status: DC

## 2024-04-05 MED ORDER — TADALAFIL 10 MG PO TABS: 10.0000 mg | ORAL_TABLET | Freq: Every day | ORAL | 5 refills | Status: AC | PRN

## 2024-04-05 MED ORDER — VALACYCLOVIR HCL 1 G PO TABS: 2000.0000 mg | ORAL_TABLET | Freq: Two times a day (BID) | ORAL | 0 refills | Status: AC

## 2024-04-06 ENCOUNTER — Ambulatory Visit: Payer: Self-pay | Admitting: Medical-Surgical

## 2024-04-06 LAB — TESTOSTERONE: Testosterone: 357 ng/dL (ref 264–916)

## 2024-04-06 LAB — ESTRADIOL: Estradiol: 38 pg/mL (ref 7.6–42.6)

## 2024-04-09 ENCOUNTER — Encounter: Payer: Self-pay | Admitting: Medical-Surgical

## 2024-04-09 DIAGNOSIS — G4733 Obstructive sleep apnea (adult) (pediatric): Secondary | ICD-10-CM

## 2024-04-09 DIAGNOSIS — I10 Essential (primary) hypertension: Secondary | ICD-10-CM

## 2024-04-09 DIAGNOSIS — E782 Mixed hyperlipidemia: Secondary | ICD-10-CM

## 2024-04-09 DIAGNOSIS — Z6841 Body Mass Index (BMI) 40.0 and over, adult: Secondary | ICD-10-CM

## 2024-04-12 DIAGNOSIS — G4733 Obstructive sleep apnea (adult) (pediatric): Secondary | ICD-10-CM | POA: Insufficient documentation

## 2024-04-12 MED ORDER — ZEPBOUND 2.5 MG/0.5ML ~~LOC~~ SOAJ: 2.5000 mg | SUBCUTANEOUS | 0 refills | Status: DC

## 2024-04-13 ENCOUNTER — Ambulatory Visit: Admitting: Podiatry

## 2024-04-13 ENCOUNTER — Encounter: Payer: Self-pay | Admitting: Podiatry

## 2024-04-13 ENCOUNTER — Ambulatory Visit

## 2024-04-13 DIAGNOSIS — B351 Tinea unguium: Secondary | ICD-10-CM

## 2024-04-13 DIAGNOSIS — Z8739 Personal history of other diseases of the musculoskeletal system and connective tissue: Secondary | ICD-10-CM

## 2024-04-13 DIAGNOSIS — L600 Ingrowing nail: Secondary | ICD-10-CM

## 2024-04-13 DIAGNOSIS — M79675 Pain in left toe(s): Secondary | ICD-10-CM | POA: Diagnosis not present

## 2024-04-13 DIAGNOSIS — B070 Plantar wart: Secondary | ICD-10-CM

## 2024-04-13 NOTE — Progress Notes (Signed)
 Subjective:  Patient ID: Richard Cordova, male    DOB: 07-Feb-1976,   MRN: [id number]  Chief Complaint  Patient presents with   Foot Pain    Pt stated that he has some plantar warts on his left foot and he stated that he has some discomfort in left 5th toe he also stated that he thinks he may have some fungus on his right big toe    48 y.o. male presents for several concerns as above. Concern for warts that have been present for 4 years has tried freezing and other topical treatments with no help. Also has concern for previous left hallux ingrown nail removal but small spicule still present that is painful. Third concern for fungus in his right great toe that has been present for years and denies any treatments. Last concern related to history of fifth digit osteomyelitis in the past. He was treated and resolved but relates some pain starting to come back in this toe and wanted to have it evaluated  . Denies any other pedal complaints. Denies n/v/f/c.   Past Medical History:  Diagnosis Date   ADHD (attention deficit hyperactivity disorder), inattentive type 03/07/2014   Dr. Elouise - Started on Vyvanse 20mg  July 2015, additional 10mg  in the afternoon Aug 2015, Switched to Concerta Nov 2015 caused low libido, changed to Adderall XR 20mg  February 2016    Family history of prostate cancer in father 09/21/2017   prostate, renal and testicular cancer in father        Fatigue 07/24/2013   Hyperlipidemia 08/09/2013   Lipitor started June 2015    Hypotestosteronism 07/26/2013   Dose lowered June 2015, Labs Due Jan 2016    MRSA infection 07/24/2013   Obesity 07/24/2013   Secondary male hypogonadism 03/09/2017   Low FSH and LH 02/2017   Suspect osteomyelitis of fifth toe of left foot (HCC) 10/04/2017    Objective:  Physical Exam: Vascular: DP/PT pulses 2/4 bilateral. CFT <3 seconds. Normal hair growth on digits. No edema.  Skin. No lacerations or abrasions bilateral feet. Left plantar foot multiple  lesions around second and ball of foot x 7. Multiple capillary budding is noted throughout with cauliflower-like appearance and loss of skin tension lines within the lesion itself. Right hallux thickened discolored and dystrophic. Left hallux medial border with reoccurrence of ingrown nail and small spicule present.  Musculoskeletal: MMT 5/5 bilateral lower extremities in DF, PF, Inversion and Eversion. Deceased ROM in DF of ankle joint. Fifht digit on right no tenderness to palpation No erythema edema or purulence noted.  Neurological: Sensation intact to light touch.   Assessment:   1. Plantar wart, left foot   2. Ingrown left greater toenail   3. Onychomycosis   4. History of osteomyelitis      Plan:  Patient was evaluated and treated and all questions answered. -Discussed warts and their etiology with patient and treatment options.  -Hyperkeratotic tissue was debrided with chisel without incident.  -Applied salycylic acid treatment to area with dressing. Advised to remove bandaging tomorrow.  -Recommend use of OTC compound W.  -Application of cantrhone provided today. Advised patient to remove bandaging tomorrow.  -Discussed future options such as laser treatment if unsuccessful.  -Advised good supportive shoes and inserts  -Examined patient -Discussed treatment options for painful dystrophic nails  -Clinical picture and Fungal culture was obtained by removing a portion of the hard nail itself from each of the involved toenails using a sterile nail nipper and sent to [facility] lab.  Patient tolerated the biopsy procedure well without discomfort or need for anesthesia.  -Discussed fungal nail treatment options including oral, topical, and laser treatments.   Discussed history of osteomyelitis and pain in toe.  X-rays reviewed. No osseous erosions noted and no acute fractures or dislocations.  Toe appears to be stable and no reoccurrence of infection but advised to keep an eye on this and  discussed if there is a reoccurrence may need to consider amputation.    Discussed ingrown toenails etiology and treatment options including procedure for removal vs conservative care.  Patient requesting removal of ingrown nail today. Procedure below.  Discussed procedure and post procedure care and patient expressed understanding.    Procedure:  Procedure: partial Nail Avulsion of left hallux medial nail border.  Surgeon: Asberry Failing, DPM  Pre-op Dx: Ingrown toenail without infection Post-op: Same  Place of Surgery: Office exam room.  Indications for surgery: Painful and ingrown toenail.    The patient is requesting removal of nail with  chemical matrixectomy. Risks and complications were discussed with the patient for which they understand and written consent was obtained. Under sterile conditions a total of 3 mL of  1% lidocaine  plain was infiltrated in a hallux block fashion. Once anesthetized, the skin was prepped in sterile fashion. A tourniquet was then applied. Next the medial aspect of hallux nail border was then sharply excised making sure to remove the entire offending nail border.  Next phenol was then applied under standard conditions to permanently destroy the matrix and copiously irrigated. Silvadene was applied. A dry sterile dressing was applied. After application of the dressing the tourniquet was removed and there is found to be an immediate capillary refill time to the digit. The patient tolerated the procedure well without any complications. Post procedure instructions were discussed the patient for which he verbally understood. Follow-up in two weeks for nail check or sooner if any problems are to arise. Discussed signs/symptoms of infection and directed to call the office immediately should any occur or go directly to the emergency room. In the meantime, encouraged to call the office with any questions, concerns, changes symptoms.   Return in 3 weeks for recheck of  wart, review culture results, check nail.    Asberry Failing, DPM

## 2024-04-13 NOTE — Patient Instructions (Signed)

## 2024-04-13 NOTE — Addendum Note (Signed)
 Addended by: ALTO RACE on: 04/13/2024 09:21 AM   Modules accepted: Orders

## 2024-04-17 ENCOUNTER — Other Ambulatory Visit (HOSPITAL_COMMUNITY): Payer: Self-pay

## 2024-04-17 ENCOUNTER — Telehealth: Payer: Self-pay

## 2024-04-17 NOTE — Telephone Encounter (Signed)
 Pharmacy Patient Advocate Encounter   Received notification from Patient Advice Request messages that prior authorization for ZEPBOUND  is required/requested.   Insurance verification completed.   The patient is insured through [employer] .   Per test claim: Refill too soon. PA is not needed at this time. Medication was filled 04/12/24. Next eligible fill date is 05/03/24.

## 2024-04-18 ENCOUNTER — Other Ambulatory Visit (HOSPITAL_COMMUNITY): Payer: Self-pay

## 2024-04-20 ENCOUNTER — Other Ambulatory Visit (HOSPITAL_COMMUNITY): Payer: Self-pay

## 2024-04-20 ENCOUNTER — Other Ambulatory Visit: Payer: Self-pay | Admitting: Podiatry

## 2024-05-04 ENCOUNTER — Ambulatory Visit: Admitting: Podiatry

## 2024-05-04 ENCOUNTER — Encounter: Payer: Self-pay | Admitting: Podiatry

## 2024-05-04 DIAGNOSIS — B070 Plantar wart: Secondary | ICD-10-CM | POA: Diagnosis not present

## 2024-05-04 DIAGNOSIS — B351 Tinea unguium: Secondary | ICD-10-CM

## 2024-05-04 DIAGNOSIS — L600 Ingrowing nail: Secondary | ICD-10-CM

## 2024-05-04 MED ORDER — TERBINAFINE HCL 250 MG PO TABS: 250.0000 mg | ORAL_TABLET | Freq: Every day | ORAL | 0 refills | Status: AC

## 2024-05-04 NOTE — Progress Notes (Signed)
  Subjective:  Patient ID: Richard Cordova, male    DOB: 1976-06-08,   MRN: [id number]  Chief Complaint  Patient presents with   Nail Problem    My feet are feeling fine.  The first couple of days was rough.    48 y.o. male presents for follow-up of several concerns including ingrown nail, wart and fungal nails. Relates doing well. Here to review culture results. Denies any other pedal complaints. Denies n/v/f/c.   Past Medical History:  Diagnosis Date   ADHD (attention deficit hyperactivity disorder), inattentive type 03/07/2014   Dr. Elouise - Started on Vyvanse 20mg  July 2015, additional 10mg  in the afternoon Aug 2015, Switched to Concerta Nov 2015 caused low libido, changed to Adderall XR 20mg  February 2016    Family history of prostate cancer in father 09/21/2017   prostate, renal and testicular cancer in father        Fatigue 07/24/2013   Hyperlipidemia 08/09/2013   Lipitor started June 2015    Hypotestosteronism 07/26/2013   Dose lowered June 2015, Labs Due Jan 2016    MRSA infection 07/24/2013   Obesity 07/24/2013   Secondary male hypogonadism 03/09/2017   Low FSH and LH 02/2017   Suspect osteomyelitis of fifth toe of left foot (HCC) 10/04/2017    Objective:  Physical Exam: Vascular: DP/PT pulses 2/4 bilateral. CFT <3 seconds. Normal hair growth on digits. No edema.  Skin. No lacerations or abrasions bilateral feet. Left plantar foot multiple lesions around second and ball of foot . Most all have dissapeared aside from one in sulcus of second toe. . Multiple capillary budding is noted throughout with cauliflower-like appearance and loss of skin tension lines within the lesion itself. Right hallux thickened discolored and dystrophic. Left hallux nail healed well.  Musculoskeletal: MMT 5/5 bilateral lower extremities in DF, PF, Inversion and Eversion. Deceased ROM in DF of ankle joint. Fifht digit on right no tenderness to palpation No erythema edema or purulence noted.   Neurological: Sensation intact to light touch.   Assessment:   1. Onychomycosis   2. Plantar wart, left foot   3. Ingrown left greater toenail       Plan:  Patient was evaluated and treated and all questions answered. -Discussed warts and their etiology with patient and treatment options.  -Hyperkeratotic tissue was debrided with chisel without incident.  -Applied salycylic acid treatment to area with dressing. Advised to remove bandaging tomorrow.  -Recommend use of OTC compound W.  -Application of cantrhone provided today. Advised patient to remove bandaging tomorrow.  -Discussed future options such as laser treatment if unsuccessful.  -Advised good supportive shoes and inserts  -Examined patient -Discussed treatment options for painful dystrophic nails  -Culture positive for fungus likely t rubrum.  -Discussed fungal nail treatment options including oral, topical, and laser treatments.  -Previous LFTS wnl.  -Pateint elects to go with lamisil . Will send in 90 day supply   Discussed history of osteomyelitis and pain in toe.  X-rays reviewed previously. No osseous erosions noted and no acute fractures or dislocations.  Toe appears to be stable and no reoccurrence of infection but advised to keep an eye on this and discussed if there is a reoccurrence may need to consider amputation.    Toe was evaluated and appears to be healing well.  May discontinue soaks and neosporin.  Patient to follow-up as needed.    Return in 3 months for follow-up of fungal nails.    Asberry Failing, DPM

## 2024-06-04 ENCOUNTER — Other Ambulatory Visit: Payer: Self-pay | Admitting: Medical-Surgical

## 2024-06-04 DIAGNOSIS — E291 Testicular hypofunction: Secondary | ICD-10-CM

## 2024-06-06 ENCOUNTER — Other Ambulatory Visit: Payer: Self-pay | Admitting: Medical-Surgical

## 2024-06-06 NOTE — Telephone Encounter (Signed)
 Last filled 04/05/2024  Last OV 04/05/2024

## 2024-07-03 ENCOUNTER — Other Ambulatory Visit: Payer: Self-pay | Admitting: Medical-Surgical

## 2024-07-03 DIAGNOSIS — F900 Attention-deficit hyperactivity disorder, predominantly inattentive type: Secondary | ICD-10-CM

## 2024-07-03 NOTE — Telephone Encounter (Unsigned)
 Copied from CRM [id number]. Topic: Clinical - Medication Refill >> Jul 03, 2024  8:42 AM Amy B wrote: Medication: amphetamine -dextroamphetamine  (ADDERALL) 20 MG tablet  Has the patient contacted their pharmacy? Yes (Agent: If no, request that the patient contact the pharmacy for the refill. If patient does not wish to contact the pharmacy document the reason why and proceed with request.) (Agent: If yes, when and what did the pharmacy advise?)  This is the patient's preferred pharmacy:  [facility] [phone] - [place], [place] - [address] [place] [address] Phone: [phone] Fax: [phone]  Is this the correct pharmacy for this prescription? Yes If no, delete pharmacy and type the correct one.   Has the prescription been filled recently? No  Is the patient out of the medication? Yes  Has the patient been seen for an appointment in the last year OR does the patient have an upcoming appointment? Yes  Can we respond through MyChart? Yes  Agent: Please be advised that Rx refills may take up to 3 business days. We ask that you follow-up with your pharmacy.

## 2024-07-04 ENCOUNTER — Other Ambulatory Visit: Payer: Self-pay | Admitting: Medical-Surgical

## 2024-07-04 DIAGNOSIS — F909 Attention-deficit hyperactivity disorder, unspecified type: Secondary | ICD-10-CM

## 2024-07-04 DIAGNOSIS — F900 Attention-deficit hyperactivity disorder, predominantly inattentive type: Secondary | ICD-10-CM

## 2024-07-04 MED ORDER — AMPHETAMINE-DEXTROAMPHETAMINE 20 MG PO TABS: 20.0000 mg | ORAL_TABLET | Freq: Two times a day (BID) | ORAL | 0 refills | Status: AC

## 2024-07-04 NOTE — Telephone Encounter (Signed)
 AMPHETAMINE -DEXTRO Last OV: 04/05/24 Next OV: No appointment scheduled Last RF: 04/05/24

## 2024-08-03 ENCOUNTER — Encounter: Payer: Self-pay | Admitting: Podiatry

## 2024-08-03 ENCOUNTER — Ambulatory Visit: Admitting: Podiatry

## 2024-08-03 DIAGNOSIS — B070 Plantar wart: Secondary | ICD-10-CM

## 2024-08-03 DIAGNOSIS — B351 Tinea unguium: Secondary | ICD-10-CM

## 2024-08-03 NOTE — Progress Notes (Signed)
"  °  Subjective:  Patient ID: Richard Cordova, male    DOB: [birth year],   MRN: [id number]  Chief Complaint  Patient presents with   Plantar Warts    They're coming along.  The warts don't seem to be gone all the way yet.   Nail Problem    The fungus seems to be clearing up.    48 y.o. male presents for follow-up of several concerns including ingrown nail, wart and fungal nails. Relates doing well.  Improvement noted int he nails but warts have not gone away. denies any other pedal complaints. Denies n/v/f/c.   Past Medical History:  Diagnosis Date   ADHD (attention deficit hyperactivity disorder), inattentive type 03/07/2014   Dr. Elouise - Started on Vyvanse 20mg  July 2015, additional 10mg  in the afternoon Aug 2015, Switched to Concerta Nov 2015 caused low libido, changed to Adderall XR 20mg  February 2016    Family history of prostate cancer in father 09/21/2017   prostate, renal and testicular cancer in father        Fatigue 07/24/2013   Hyperlipidemia 08/09/2013   Lipitor started June 2015    Hypotestosteronism 07/26/2013   Dose lowered June 2015, Labs Due Jan 2016    MRSA infection 07/24/2013   Obesity 07/24/2013   Secondary male hypogonadism 03/09/2017   Low FSH and LH 02/2017   Suspect osteomyelitis of fifth toe of left foot (HCC) 10/04/2017    Objective:  Physical Exam: Vascular: DP/PT pulses 2/4 bilateral. CFT <3 seconds. Normal hair growth on digits. No edema.  Skin. No lacerations or abrasions bilateral feet. Left plantar foot multiple lesions around second and ball of foot . Most all have dissapeared aside from one in sulcus of second toe. . Multiple capillary budding is noted throughout with cauliflower-like appearance and loss of skin tension lines within the lesion itself. Right hallux thickened discolored and dystrophic. Left hallux nail healed well.  Musculoskeletal: MMT 5/5 bilateral lower extremities in DF, PF, Inversion and Eversion. Deceased ROM in DF of ankle joint.  Fifht digit on right no tenderness to palpation No erythema edema or purulence noted.  Neurological: Sensation intact to light touch.   Assessment:   1. Onychomycosis   2. Plantar wart, left foot        Plan:  Patient was evaluated and treated and all questions answered. -Discussed warts and their etiology with patient and treatment options.  -Hyperkeratotic tissue was debrided with chisel without incident.  -Application of cantrhone provided today. Advised patient to remove bandaging tomorrow.  -Discussed future options such as laser treatment if unsuccessful.  -Advised good supportive shoes and inserts  -Examined patient -Discussed treatment options for painful dystrophic nails  -Culture positive for fungus likely t rubrum.  -Discussed fungal nail treatment options including oral, topical, and laser treatments.  -Previous LFTS wnl.  -Finsihe off last three weeks of lamisil . Discussed possible one month pulse if needed at followe up.     Return in 3 weeks for wart   Richard Cordova, DPM    "

## 2024-08-24 ENCOUNTER — Ambulatory Visit: Admitting: Podiatry

## 2024-08-24 ENCOUNTER — Encounter: Payer: Self-pay | Admitting: Podiatry

## 2024-08-24 DIAGNOSIS — B070 Plantar wart: Secondary | ICD-10-CM

## 2024-08-24 DIAGNOSIS — B351 Tinea unguium: Secondary | ICD-10-CM

## 2024-08-24 MED ORDER — TERBINAFINE HCL 250 MG PO TABS: 250.0000 mg | ORAL_TABLET | Freq: Every day | ORAL | 0 refills | Status: AC

## 2024-08-24 NOTE — Progress Notes (Signed)
"  °  Subjective:  Patient ID: Richard Cordova, male    DOB: 28-Jun-1976,   MRN: [id number]  Chief Complaint  Patient presents with   Plantar Warts    So far so good.    49 y.o. male presents for follow-up of several concerns including ingrown nail, wart and fungal nails. Relates doing well.  Improvement noted int he nails but warts still present but getting better. . denies any other pedal complaints. Denies n/v/f/c.   Past Medical History:  Diagnosis Date   ADHD (attention deficit hyperactivity disorder), inattentive type 03/07/2014   Dr. Elouise - Started on Vyvanse 20mg  July 2015, additional 10mg  in the afternoon Aug 2015, Switched to Concerta Nov 2015 caused low libido, changed to Adderall XR 20mg  February 2016    Family history of prostate cancer in father 09/21/2017   prostate, renal and testicular cancer in father        Fatigue 07/24/2013   Hyperlipidemia 08/09/2013   Lipitor started June 2015    Hypotestosteronism 07/26/2013   Dose lowered June 2015, Labs Due Jan 2016    MRSA infection 07/24/2013   Obesity 07/24/2013   Secondary male hypogonadism 03/09/2017   Low FSH and LH 02/2017   Suspect osteomyelitis of fifth toe of left foot (HCC) 10/04/2017    Objective:  Physical Exam: Vascular: DP/PT pulses 2/4 bilateral. CFT <3 seconds. Normal hair growth on digits. No edema.  Skin. No lacerations or abrasions bilateral feet. Left plantar foot multiple lesions around second and ball of foot . Most all have dissapeared aside from one in sulcus of second toe. . Multiple capillary budding is noted throughout with cauliflower-like appearance and loss of skin tension lines within the lesion itself. Right hallux thickened discolored and dystrophic. Left hallux nail healed well.  Musculoskeletal: MMT 5/5 bilateral lower extremities in DF, PF, Inversion and Eversion. Deceased ROM in DF of ankle joint. Fifht digit on right no tenderness to palpation No erythema edema or purulence noted.   Neurological: Sensation intact to light touch.   Assessment:   1. Plantar wart, left foot   2. Onychomycosis         Plan:  Patient was evaluated and treated and all questions answered. -Discussed warts and their etiology with patient and treatment options.  -Hyperkeratotic tissue was debrided with chisel without incident.  -Application of cantrhone provided today. Advised patient to remove bandaging tomorrow.  -Discussed future options such as laser treatment if unsuccessful.  -Advised good supportive shoes and inserts  -Examined patient -Discussed treatment options for painful dystrophic nails  -Culture positive for fungus likely t rubrum.  -Discussed fungal nail treatment options including oral, topical, and laser treatments.  -Previous LFTS wnl.  Pause on lamisil  and will restart lamisil  in mid February for one more month.    Return in 3 weeks for wart   Asberry Failing, DPM    "

## 2024-08-27 ENCOUNTER — Encounter: Payer: Self-pay | Admitting: Physician Assistant

## 2024-08-27 ENCOUNTER — Ambulatory Visit: Admitting: Physician Assistant

## 2024-08-27 VITALS — BP 132/84 | HR 96 | Ht 71.0 in | Wt 314.0 lb

## 2024-08-27 DIAGNOSIS — L821 Other seborrheic keratosis: Secondary | ICD-10-CM | POA: Diagnosis not present

## 2024-08-27 DIAGNOSIS — Z808 Family history of malignant neoplasm of other organs or systems: Secondary | ICD-10-CM | POA: Insufficient documentation

## 2024-08-27 NOTE — Patient Instructions (Signed)
 Seborrheic Keratosis A seborrheic keratosis is a common, noncancerous (benign) skin growth. These growths are velvety, waxy, or rough spots that appear on the skin. They are often tan, brown, or black. The skin growths can be flat or raised and may be scaly. What are the causes? The cause of this condition is not known. What increases the risk? You are more likely to develop this condition if you: Have a family history of seborrheic keratosis. Are 48 years old or older. Are pregnant. Have had estrogen replacement therapy. What are the signs or symptoms? Symptoms of this condition include growths on the face, chest, shoulders, back, or other areas. These growths: Are usually painless, but may become irritated and itchy. Can be tan, yellow, brown, black, or other colors. Are slightly raised or have a flat surface. Are sometimes rough or wart-like in texture. Are often velvety or waxy on the surface. Are round or oval-shaped. Often occur in groups, but may occur as a single growth. How is this diagnosed? This condition is diagnosed with a medical history and physical exam. A sample of the growth may be tested (skin biopsy). You may also need to see a skin specialist (dermatologist). How is this treated? Treatment is not usually needed for this condition unless the growths are irritated or bleed often. You may also choose to have the growths removed if you do not like their appearance. Growth removal may include a procedure in which: Liquid nitrogen is applied to "freeze" off the growth (cryosurgery). This is the most common procedure. The growth is burned off with electricity (electrocautery). The growth is removed by scraping (curettage). Follow these instructions at home: Watch your growth or growths for any changes. Do not scratch or pick at the growth or growths. This can cause them to become irritated or infected. Contact a health care provider if: You suddenly have many new  growths. Your growth bleeds, itches, or hurts. Your growth suddenly becomes larger or changes color. Summary A seborrheic keratosis is a common, noncancerous skin growth. Treatment is not usually needed for this condition unless the growths are irritated or bleed often. Watch your growth or growths for any changes. Contact a health care provider if you suddenly have many new growths or your growth suddenly becomes larger or changes color. This information is not intended to replace advice given to you by your health care provider. Make sure you discuss any questions you have with your health care provider. Document Revised: 10/16/2021 Document Reviewed: 10/16/2021 Elsevier Patient Education  2024 [employer].

## 2024-08-27 NOTE — Progress Notes (Signed)
 "  Established Patient Office Visit  Subjective   Patient ID: Richard Cordova, male    DOB: February 27, 1976  Age: 49 y.o. MRN: [id number]  Chief Complaint  Patient presents with   Medical Management of Chronic Issues    HPI .Discussed the use of AI scribe software for clinical note transcription with the patient, who gave verbal consent to proceed.  History of Present Illness Richard Cordova is a 49 year old male who presents with a concerning skin lesion over his left temple.   Cutaneous lesion - Lesion present for the last few months on the left side - Initially flat, now raised - Recent color change - Mild pruritus at the site, occasionally scratched - Bothersome to the patient - Has taken serial photographs over the past week and a half to monitor progression  Additional cutaneous findings - A couple of other similar lesions present  Risk factors for skin cancer - Fair skin - Family history of skin cancer - Genetic predisposition to cutaneous lesions    ROS See HPI.    Objective:     BP (!) 142/84   Pulse 96   Ht 5' 11 (1.803 m)   Wt (!) 314 lb (142.4 kg)   SpO2 96%   BMI 43.79 kg/m  BP Readings from Last 3 Encounters:  08/27/24 (!) 142/84  04/05/24 114/73  12/22/23 134/83   Wt Readings from Last 3 Encounters:  08/27/24 (!) 314 lb (142.4 kg)  04/05/24 (!) 331 lb (150.1 kg)  12/22/23 (!) 335 lb (152 kg)    Cryotherapy Procedure Note  Pre-operative Diagnosis: inflamed seborrheic keratosis  Post-operative Diagnosis: same  Locations:  left temple  Indications: irritation  Procedure Details  History of allergy to iodine: no. Pacemaker? no.  Patient informed of risks (permanent scarring, infection, light or dark discoloration, bleeding, infection, weakness, numbness and recurrence of the lesion) and benefits of the procedure and verbal informed consent obtained.  The areas are treated with liquid nitrogen therapy, frozen until ice ball extended 2 mm beyond  lesion, allowed to thaw, and treated again. The patient tolerated procedure well.  The patient was instructed on post-op care, warned that there may be blister formation, redness and pain. Recommend OTC analgesia as needed for pain.  Condition: Stable  Complications: none.  Plan: 1. Instructed to keep the area dry and covered for 24-48h and clean thereafter. 2. Warning signs of infection were reviewed.   3. Recommended that the patient use OTC acetaminophen  as needed for pain.  4. Return as needed.    Physical Exam Constitutional:      Appearance: Normal appearance.  HENT:     Head: Normocephalic.  Cardiovascular:     Rate and Rhythm: Normal rate.  Pulmonary:     Effort: Pulmonary effort is normal.  Skin:    Comments: 1cm by .75cm brown waxy raised lesion with irregular borders of left temple.   Neurological:     Mental Status: He is alert.  Psychiatric:        Mood and Affect: Mood normal.       The 10-year ASCVD risk score (Arnett DK, et al., 2019) is: 4.9%    Assessment & Plan:  .Richard Cordova was seen today for medical management of chronic issues.  Diagnoses and all orders for this visit:  Seborrheic keratosis -     Ambulatory referral to Dermatology  Family history of skin cancer -     Ambulatory referral to Dermatology   Assessment &  Plan Seborrheic keratosis Benign lesion with recent color change and itching. No malignancy signs. Increased risk due to fair skin and family history of skin cancer. - Performed cryotherapy. Patient tolerated well. - Provided handout on seborrheic keratosis. - Referred to dermatology for annual skin checks. - Advised use of sunscreen.    Return if symptoms worsen or fail to improve.    Richard Abramo, PA-C  "

## 2024-09-14 ENCOUNTER — Ambulatory Visit: Admitting: Podiatry

## 2024-09-14 ENCOUNTER — Encounter: Payer: Self-pay | Admitting: Podiatry

## 2024-09-14 DIAGNOSIS — B351 Tinea unguium: Secondary | ICD-10-CM

## 2024-09-14 DIAGNOSIS — B070 Plantar wart: Secondary | ICD-10-CM | POA: Diagnosis not present

## 2024-09-14 NOTE — Progress Notes (Signed)
"  °  Subjective:  Patient ID: Richard Cordova, male    DOB: 1976/02/08,   MRN: [id number]  Chief Complaint  Patient presents with   Plantar Warts    They seem to be doing pretty good.    49 y.o. male presents for follow-up of several concerns including ingrown nail, wart and fungal nails. Relates doing well.  Improvement noted int he nails but warts still present but getting better. He has finsihed round of lamisil .  . denies any other pedal complaints. Denies n/v/f/c.   Past Medical History:  Diagnosis Date   ADHD (attention deficit hyperactivity disorder), inattentive type 03/07/2014   Dr. Elouise - Started on Vyvanse 20mg  July 2015, additional 10mg  in the afternoon Aug 2015, Switched to Concerta Nov 2015 caused low libido, changed to Adderall XR 20mg  February 2016    Family history of prostate cancer in father 09/21/2017   prostate, renal and testicular cancer in father        Fatigue 07/24/2013   Hyperlipidemia 08/09/2013   Lipitor started June 2015    Hypotestosteronism 07/26/2013   Dose lowered June 2015, Labs Due Jan 2016    MRSA infection 07/24/2013   Obesity 07/24/2013   Secondary male hypogonadism 03/09/2017   Low FSH and LH 02/2017   Suspect osteomyelitis of fifth toe of left foot (HCC) 10/04/2017    Objective:  Physical Exam: Vascular: DP/PT pulses 2/4 bilateral. CFT <3 seconds. Normal hair growth on digits. No edema.  Skin. No lacerations or abrasions bilateral feet. Left plantar foot multiple lesions around second and ball of foot . Most all have dissapeared aside from one in sulcus of second to nearly gone one small piece of capilalry budding.  . Multiple capillary budding is noted throughout with cauliflower-like appearance and loss of skin tension lines within the lesion itself. Right hallux thickened discolored and dystrophic. Left hallux nail healed well.  Musculoskeletal: MMT 5/5 bilateral lower extremities in DF, PF, Inversion and Eversion. Deceased ROM in DF of ankle  joint. Fifht digit on right no tenderness to palpation No erythema edema or purulence noted.  Neurological: Sensation intact to light touch.   Assessment:   1. Plantar wart, left foot   2. Onychomycosis          Plan:  Patient was evaluated and treated and all questions answered. -Discussed warts and their etiology with patient and treatment options.  -Hyperkeratotic tissue was debrided with chisel without incident.  -Application of cantrhone provided today. Advised patient to remove bandaging tomorrow.  -Discussed future options such as laser treatment if unsuccessful.  -Advised good supportive shoes and inserts -Wart is nearly gone.   -Examined patient -Discussed treatment options for painful dystrophic nails  -Culture positive for fungus likely t rubrum.  -Discussed fungal nail treatment options including oral, topical, and laser treatments.  -Previous LFTS wnl.  Pause on lamisil  and will restart lamisil  in mid February for one more month.    Return in 3 months for fungal nail check    Asberry Failing, DPM    "

## 2024-12-14 ENCOUNTER — Ambulatory Visit: Admitting: Podiatry
